# Patient Record
Sex: Male | Born: 1955
Health system: Southern US, Community
[De-identification: ages and names within clinical notes are randomized; demographics above are authoritative.]

## PROBLEM LIST (undated history)

## (undated) DIAGNOSIS — E119 Type 2 diabetes mellitus without complications: Secondary | ICD-10-CM

## (undated) DIAGNOSIS — K219 Gastro-esophageal reflux disease without esophagitis: Secondary | ICD-10-CM

## (undated) DIAGNOSIS — I10 Essential (primary) hypertension: Secondary | ICD-10-CM

## (undated) DIAGNOSIS — E785 Hyperlipidemia, unspecified: Secondary | ICD-10-CM

## (undated) DIAGNOSIS — M795 Residual foreign body in soft tissue: Secondary | ICD-10-CM

## (undated) HISTORY — PX: TONSILLECTOMY: SUR1361

## (undated) HISTORY — PX: CERVICAL FUSION: SHX112

---

## 2003-05-01 ENCOUNTER — Encounter: Admission: RE | Admit: 2003-05-01 | Discharge: 2003-05-01 | Payer: Self-pay | Admitting: Family Medicine

## 2003-05-04 ENCOUNTER — Encounter: Admission: RE | Admit: 2003-05-04 | Discharge: 2003-05-04 | Payer: Self-pay | Admitting: Family Medicine

## 2003-05-15 ENCOUNTER — Ambulatory Visit (HOSPITAL_COMMUNITY): Admission: RE | Admit: 2003-05-15 | Discharge: 2003-05-16 | Payer: Self-pay | Admitting: Neurosurgery

## 2003-07-08 ENCOUNTER — Encounter: Admission: RE | Admit: 2003-07-08 | Discharge: 2003-10-06 | Payer: Self-pay | Admitting: Neurosurgery

## 2006-11-22 ENCOUNTER — Encounter: Admission: RE | Admit: 2006-11-22 | Discharge: 2007-01-10 | Payer: Self-pay | Admitting: Family Medicine

## 2010-05-28 NOTE — Op Note (Signed)
NAME:  Chad Weeks, Chad Weeks NO.:  [id number]   MEDICAL RECORD NO.:  [id number]                   PATIENT TYPE:  OIB   LOCATION:  3013                                 FACILITY:  [facility]   PHYSICIAN:  Clydene Fake, M.D.               DATE OF BIRTH:  [birth year]   DATE OF PROCEDURE:  05/15/2003  DATE OF DISCHARGE:  05/16/2003                                 OPERATIVE REPORT   PREOPERATIVE DIAGNOSIS:  HNP and spondylosis at C6-7, right-sided  radiculopathy.   POSTOPERATIVE DIAGNOSIS:  HNP and spondylosis at C6-7, right-sided  radiculopathy.   PROCEDURE:  Anterior cervical decompression, diskectomy and fusion at C6-7  with LifeNet allograft bone and ___________ anterior cervical plate.   SURGEON:  Clydene Fake, M.D.   ASSISTANT:  Danae Orleans. Venetia Maxon, M.D.   ANESTHESIA:  General endotracheal tube anesthesia.   ESTIMATED BLOOD LOSS:  Minimal.   BLOOD REPLACED:  None.   DRAINS:  None.   COMPLICATIONS:  None.   INDICATIONS FOR PROCEDURE:  The patient is a 55 year old gentleman who in  December had started having neck pain, pain in between shoulder blades and  going down the right arm that resolved slightly, but then recurred eight  weeks or so ago with significant pain now with numbness and weakness in the  arm.  MRI was done showing large disk herniation at C6-7 on the right,  spondylitic changes, and the patient is brought in for decompression and  fusion procedure.   DESCRIPTION OF PROCEDURE:  The patient was brought into the operating room  and after general anesthesia was induced, the patient was placed in 10  pounds of Holter traction and prepped and draped in the usual sterile  fashion.  Incision was injected with 10 mL of 1% lidocaine with epinephrine.  Incision was then made from the midline to the anterior border of the  sternocleidomastoid muscle on the left side and back incision was similarly  taken down to the platysma and hemostasis was  obtained with Bovie  cauterization.  The platysma was incised with the Bovie and then blunt  dissection was taken through the anterior cervical fascia of the anterior  cervical spine.  Needle was placed in the disk space and x-rays were  obtained showing this was the 6-7 disk space.  The disk space was incised  with a 15 blade and partial diskectomy performed with pituitary rongeurs.  Longus coli muscles were reflected laterally using the Bovie and self  retaining retractor system was then placed.  The disk space was then again  incised with a 15 blade and diskectomy performed with pituitary rongeurs and  curets.  Microscope was brought in for microdissection.  At this point, high  speed drill was used to remove cartilaginous endplate and osteophytes.  1  and 2 mm Kerrison punches were then used to remove posterior osteophytes and  posterior ligaments  and free fragments of disk were found.  These were  removed.  They were from the right side and when we removed them we  decompressed the central canal and the lateral nerve root.  Bilateral  foraminotomies were performed and when we had finished, we had good  decompression of the central canal and bilateral nerve roots especially  right.  Hemostasis was obtained with Gelfoam and thrombin and this was  irrigated out.  Height of the disk space was measured with the LifeNet  trials, measuring 7 mm and a 7 mm LifeNet bone was tapped into place,  countersunk about 1.5 mm or so. There was plenty of room for the bone graft  to the dura and checked with nerve hook.  The wound was irrigated with  antibiotic solution.  Prior to bringing the microscope in, distraction pins  were placed in the C6 and 7 and the interspace distracted.  The pins were  now removed and hemostasis was obtained with Gelfoam and thrombin.  The  Uniplate was then placed over the anterior cervical spine and one screw  placed in the C6 and one in C7. These were tightened down and  the locking  mechanism was tightened.  Lateral x-ray was then taken of the neck showing  good position of plate, screws, and interbody bone plug at the C6-7 level.  Retractor was removed.  Hemostasis was obtained with Gelfoam and thrombin  and bipolar cauterization.  Gelfoam was irrigated out and when we had good  hemostasis, the platysma was closed with 3-0 Vicryl interrupted suture, the  subcutaneous tissue closed with the same, and skin closed with Benzoin and  Steri-Strips.  Dressing was placed.  The patient was placed in a soft  cervical collar, awakened from general anesthesia, and transferred to the  recovery room in stable condition.                                               Clydene Fake, M.D.    JRH/MEDQ  D:  05/15/2003  T:  05/16/2003  Job:  [id number]

## 2015-04-30 DIAGNOSIS — S80851A Superficial foreign body, right lower leg, initial encounter: Secondary | ICD-10-CM | POA: Diagnosis not present

## 2015-06-29 DIAGNOSIS — E1165 Type 2 diabetes mellitus with hyperglycemia: Secondary | ICD-10-CM | POA: Diagnosis not present

## 2015-06-29 DIAGNOSIS — E7800 Pure hypercholesterolemia, unspecified: Secondary | ICD-10-CM | POA: Diagnosis not present

## 2015-06-29 DIAGNOSIS — I10 Essential (primary) hypertension: Secondary | ICD-10-CM | POA: Diagnosis not present

## 2015-06-29 DIAGNOSIS — Z0000 Encounter for general adult medical examination without abnormal findings: Secondary | ICD-10-CM | POA: Diagnosis not present

## 2015-06-29 DIAGNOSIS — K219 Gastro-esophageal reflux disease without esophagitis: Secondary | ICD-10-CM | POA: Diagnosis not present

## 2015-07-02 DIAGNOSIS — Z125 Encounter for screening for malignant neoplasm of prostate: Secondary | ICD-10-CM | POA: Diagnosis not present

## 2015-07-02 DIAGNOSIS — Z1159 Encounter for screening for other viral diseases: Secondary | ICD-10-CM | POA: Diagnosis not present

## 2015-07-02 DIAGNOSIS — E1165 Type 2 diabetes mellitus with hyperglycemia: Secondary | ICD-10-CM | POA: Diagnosis not present

## 2015-07-02 DIAGNOSIS — Z0000 Encounter for general adult medical examination without abnormal findings: Secondary | ICD-10-CM | POA: Diagnosis not present

## 2015-10-23 DIAGNOSIS — Z23 Encounter for immunization: Secondary | ICD-10-CM | POA: Diagnosis not present

## 2015-11-03 DIAGNOSIS — S80851D Superficial foreign body, right lower leg, subsequent encounter: Secondary | ICD-10-CM | POA: Diagnosis not present

## 2015-11-03 DIAGNOSIS — M795 Residual foreign body in soft tissue: Secondary | ICD-10-CM | POA: Diagnosis not present

## 2015-11-27 DIAGNOSIS — L0390 Cellulitis, unspecified: Secondary | ICD-10-CM | POA: Diagnosis not present

## 2015-12-29 DIAGNOSIS — E1165 Type 2 diabetes mellitus with hyperglycemia: Secondary | ICD-10-CM | POA: Diagnosis not present

## 2016-02-24 DIAGNOSIS — E119 Type 2 diabetes mellitus without complications: Secondary | ICD-10-CM | POA: Diagnosis not present

## 2016-03-29 DIAGNOSIS — E119 Type 2 diabetes mellitus without complications: Secondary | ICD-10-CM | POA: Diagnosis not present

## 2016-07-28 DIAGNOSIS — Z23 Encounter for immunization: Secondary | ICD-10-CM | POA: Diagnosis not present

## 2016-07-28 DIAGNOSIS — I10 Essential (primary) hypertension: Secondary | ICD-10-CM | POA: Diagnosis not present

## 2016-07-28 DIAGNOSIS — Z0000 Encounter for general adult medical examination without abnormal findings: Secondary | ICD-10-CM | POA: Diagnosis not present

## 2016-07-28 DIAGNOSIS — Z13220 Encounter for screening for lipoid disorders: Secondary | ICD-10-CM | POA: Diagnosis not present

## 2016-07-28 DIAGNOSIS — K219 Gastro-esophageal reflux disease without esophagitis: Secondary | ICD-10-CM | POA: Diagnosis not present

## 2016-07-28 DIAGNOSIS — E1165 Type 2 diabetes mellitus with hyperglycemia: Secondary | ICD-10-CM | POA: Diagnosis not present

## 2016-09-26 DIAGNOSIS — R05 Cough: Secondary | ICD-10-CM | POA: Diagnosis not present

## 2016-10-03 DIAGNOSIS — L03012 Cellulitis of left finger: Secondary | ICD-10-CM | POA: Diagnosis not present

## 2016-10-03 DIAGNOSIS — S60455A Superficial foreign body of left ring finger, initial encounter: Secondary | ICD-10-CM | POA: Diagnosis not present

## 2016-10-06 DIAGNOSIS — L03012 Cellulitis of left finger: Secondary | ICD-10-CM | POA: Diagnosis not present

## 2016-10-06 DIAGNOSIS — I10 Essential (primary) hypertension: Secondary | ICD-10-CM | POA: Diagnosis not present

## 2016-10-06 DIAGNOSIS — E1165 Type 2 diabetes mellitus with hyperglycemia: Secondary | ICD-10-CM | POA: Diagnosis not present

## 2016-11-03 DIAGNOSIS — L03012 Cellulitis of left finger: Secondary | ICD-10-CM | POA: Diagnosis not present

## 2016-11-03 DIAGNOSIS — Z23 Encounter for immunization: Secondary | ICD-10-CM | POA: Diagnosis not present

## 2016-11-03 DIAGNOSIS — T148XXA Other injury of unspecified body region, initial encounter: Secondary | ICD-10-CM | POA: Diagnosis not present

## 2017-01-30 DIAGNOSIS — S60459A Superficial foreign body of unspecified finger, initial encounter: Secondary | ICD-10-CM | POA: Diagnosis not present

## 2017-01-30 DIAGNOSIS — E1165 Type 2 diabetes mellitus with hyperglycemia: Secondary | ICD-10-CM | POA: Diagnosis not present

## 2017-02-06 DIAGNOSIS — M795 Residual foreign body in soft tissue: Secondary | ICD-10-CM | POA: Diagnosis not present

## 2017-02-13 ENCOUNTER — Other Ambulatory Visit: Payer: Self-pay | Admitting: Orthopedic Surgery

## 2017-02-13 DIAGNOSIS — M795 Residual foreign body in soft tissue: Secondary | ICD-10-CM

## 2017-02-14 ENCOUNTER — Ambulatory Visit
Admission: RE | Admit: 2017-02-14 | Discharge: 2017-02-14 | Disposition: A | Discharge status: Home / Self Care | Payer: BLUE CROSS/BLUE SHIELD | Source: Ambulatory Visit | Attending: Orthopedic Surgery | Admitting: Orthopedic Surgery

## 2017-02-14 DIAGNOSIS — M7989 Other specified soft tissue disorders: Secondary | ICD-10-CM | POA: Diagnosis not present

## 2017-02-14 DIAGNOSIS — M795 Residual foreign body in soft tissue: Secondary | ICD-10-CM

## 2017-02-15 ENCOUNTER — Other Ambulatory Visit: Payer: Self-pay | Admitting: Orthopedic Surgery

## 2017-02-15 DIAGNOSIS — M795 Residual foreign body in soft tissue: Secondary | ICD-10-CM

## 2017-02-20 ENCOUNTER — Other Ambulatory Visit: Payer: Self-pay | Admitting: Orthopedic Surgery

## 2017-02-20 DIAGNOSIS — M795 Residual foreign body in soft tissue: Secondary | ICD-10-CM | POA: Diagnosis not present

## 2017-02-21 ENCOUNTER — Other Ambulatory Visit: Payer: Self-pay

## 2017-02-21 ENCOUNTER — Encounter (HOSPITAL_BASED_OUTPATIENT_CLINIC_OR_DEPARTMENT_OTHER)
Admission: RE | Admit: 2017-02-21 | Discharge: 2017-02-21 | Disposition: A | Discharge status: Home / Self Care | Payer: BLUE CROSS/BLUE SHIELD | Source: Ambulatory Visit | Attending: Orthopedic Surgery | Admitting: Orthopedic Surgery

## 2017-02-21 ENCOUNTER — Encounter (HOSPITAL_BASED_OUTPATIENT_CLINIC_OR_DEPARTMENT_OTHER): Payer: Self-pay | Admitting: *Deleted

## 2017-02-21 DIAGNOSIS — Z79899 Other long term (current) drug therapy: Secondary | ICD-10-CM | POA: Diagnosis not present

## 2017-02-21 DIAGNOSIS — E119 Type 2 diabetes mellitus without complications: Secondary | ICD-10-CM | POA: Diagnosis not present

## 2017-02-21 DIAGNOSIS — I10 Essential (primary) hypertension: Secondary | ICD-10-CM | POA: Diagnosis not present

## 2017-02-21 DIAGNOSIS — E785 Hyperlipidemia, unspecified: Secondary | ICD-10-CM | POA: Diagnosis not present

## 2017-02-21 DIAGNOSIS — Z7984 Long term (current) use of oral hypoglycemic drugs: Secondary | ICD-10-CM | POA: Diagnosis not present

## 2017-02-21 DIAGNOSIS — Z9889 Other specified postprocedural states: Secondary | ICD-10-CM | POA: Diagnosis not present

## 2017-02-21 DIAGNOSIS — M60242 Foreign body granuloma of soft tissue, not elsewhere classified, left hand: Secondary | ICD-10-CM | POA: Diagnosis present

## 2017-02-21 DIAGNOSIS — Z981 Arthrodesis status: Secondary | ICD-10-CM | POA: Diagnosis not present

## 2017-02-21 DIAGNOSIS — K219 Gastro-esophageal reflux disease without esophagitis: Secondary | ICD-10-CM | POA: Diagnosis not present

## 2017-02-21 DIAGNOSIS — L928 Other granulomatous disorders of the skin and subcutaneous tissue: Secondary | ICD-10-CM | POA: Diagnosis not present

## 2017-02-21 LAB — BASIC METABOLIC PANEL
Anion gap: 11 (ref 5–15)
BUN: 13 mg/dL (ref 6–20)
CO2: 23 mmol/L (ref 22–32)
Calcium: 9.1 mg/dL (ref 8.9–10.3)
Chloride: 92 mmol/L — ABNORMAL LOW (ref 101–111)
Creatinine, Ser: 0.99 mg/dL (ref 0.61–1.24)
GFR calc Af Amer: >60 mL/min (ref >60)
GFR calc non Af Amer: >60 mL/min (ref >60)
Glucose, Bld: 174 mg/dL — ABNORMAL HIGH (ref 65–99)
Potassium: 5.1 mmol/L (ref 3.5–5.1)
Sodium: 126 mmol/L — ABNORMAL LOW (ref 135–145)

## 2017-02-22 NOTE — Progress Notes (Signed)
Lab results per Dr. Acey Lavarignan- will obtain [place] [place].

## 2017-02-23 ENCOUNTER — Other Ambulatory Visit: Payer: Self-pay

## 2017-02-23 ENCOUNTER — Encounter (HOSPITAL_BASED_OUTPATIENT_CLINIC_OR_DEPARTMENT_OTHER)
Admission: RE | Disposition: A | Discharge status: Home / Self Care | Payer: Self-pay | Source: Ambulatory Visit | Attending: Orthopedic Surgery

## 2017-02-23 ENCOUNTER — Ambulatory Visit (HOSPITAL_BASED_OUTPATIENT_CLINIC_OR_DEPARTMENT_OTHER): Payer: BLUE CROSS/BLUE SHIELD | Admitting: Anesthesiology

## 2017-02-23 ENCOUNTER — Encounter (HOSPITAL_BASED_OUTPATIENT_CLINIC_OR_DEPARTMENT_OTHER): Payer: Self-pay | Admitting: *Deleted

## 2017-02-23 ENCOUNTER — Ambulatory Visit (HOSPITAL_BASED_OUTPATIENT_CLINIC_OR_DEPARTMENT_OTHER)
Admission: RE | Admit: 2017-02-23 | Discharge: 2017-02-23 | Disposition: A | Discharge status: Home / Self Care | Payer: BLUE CROSS/BLUE SHIELD | Source: Ambulatory Visit | Attending: Orthopedic Surgery | Admitting: Orthopedic Surgery

## 2017-02-23 DIAGNOSIS — M7989 Other specified soft tissue disorders: Secondary | ICD-10-CM | POA: Diagnosis not present

## 2017-02-23 DIAGNOSIS — E119 Type 2 diabetes mellitus without complications: Secondary | ICD-10-CM | POA: Insufficient documentation

## 2017-02-23 DIAGNOSIS — E785 Hyperlipidemia, unspecified: Secondary | ICD-10-CM | POA: Insufficient documentation

## 2017-02-23 DIAGNOSIS — K219 Gastro-esophageal reflux disease without esophagitis: Secondary | ICD-10-CM | POA: Insufficient documentation

## 2017-02-23 DIAGNOSIS — Z79899 Other long term (current) drug therapy: Secondary | ICD-10-CM | POA: Insufficient documentation

## 2017-02-23 DIAGNOSIS — Z981 Arthrodesis status: Secondary | ICD-10-CM | POA: Insufficient documentation

## 2017-02-23 DIAGNOSIS — M60242 Foreign body granuloma of soft tissue, not elsewhere classified, left hand: Secondary | ICD-10-CM | POA: Diagnosis not present

## 2017-02-23 DIAGNOSIS — Z9889 Other specified postprocedural states: Secondary | ICD-10-CM | POA: Insufficient documentation

## 2017-02-23 DIAGNOSIS — D2112 Benign neoplasm of connective and other soft tissue of left upper limb, including shoulder: Secondary | ICD-10-CM | POA: Diagnosis not present

## 2017-02-23 DIAGNOSIS — I10 Essential (primary) hypertension: Secondary | ICD-10-CM | POA: Insufficient documentation

## 2017-02-23 DIAGNOSIS — Z7984 Long term (current) use of oral hypoglycemic drugs: Secondary | ICD-10-CM | POA: Diagnosis not present

## 2017-02-23 DIAGNOSIS — L928 Other granulomatous disorders of the skin and subcutaneous tissue: Secondary | ICD-10-CM | POA: Diagnosis not present

## 2017-02-23 HISTORY — PX: INCISION AND DRAINAGE WOUND WITH FOREIGN BODY REMOVAL: SHX5635

## 2017-02-23 HISTORY — DX: Type 2 diabetes mellitus without complications: E11.9

## 2017-02-23 HISTORY — DX: Essential (primary) hypertension: I10

## 2017-02-23 HISTORY — DX: Gastro-esophageal reflux disease without esophagitis: K21.9

## 2017-02-23 HISTORY — DX: Hyperlipidemia, unspecified: E78.5

## 2017-02-23 HISTORY — DX: Residual foreign body in soft tissue: M79.5

## 2017-02-23 LAB — POCT I-STAT, CHEM 8
BUN: 17 mg/dL (ref 6–20)
Calcium, Ion: 1.13 mmol/L — ABNORMAL LOW (ref 1.15–1.40)
Chloride: 92 mmol/L — ABNORMAL LOW (ref 101–111)
Creatinine, Ser: 0.9 mg/dL (ref 0.61–1.24)
Glucose, Bld: 177 mg/dL — ABNORMAL HIGH (ref 65–99)
HCT: 38 % — ABNORMAL LOW (ref 39.0–52.0)
Hemoglobin: 12.9 g/dL — ABNORMAL LOW (ref 13.0–17.0)
Potassium: 4.6 mmol/L (ref 3.5–5.1)
Sodium: 128 mmol/L — ABNORMAL LOW (ref 135–145)
TCO2: 24 mmol/L (ref 22–32)

## 2017-02-23 LAB — GLUCOSE, CAPILLARY: Glucose-Capillary: 158 mg/dL — ABNORMAL HIGH (ref 65–99)

## 2017-02-23 SURGERY — INCISION AND DRAINAGE WOUND WITH FOREIGN BODY REMOVAL
Anesthesia: General | Site: Finger | Laterality: Left

## 2017-02-23 MED ORDER — LIDOCAINE HCL (PF) 0.5 % IJ SOLN
INTRAMUSCULAR | Status: DC | PRN
  Administered 2017-02-23: 35 mL via INTRAVENOUS

## 2017-02-23 MED ORDER — LACTATED RINGERS IV SOLN
INTRAVENOUS | Status: DC
  Administered 2017-02-23: 12:00:00 via INTRAVENOUS

## 2017-02-23 MED ORDER — MIDAZOLAM HCL 2 MG/2ML IJ SOLN
INTRAMUSCULAR | Status: AC
  Filled 2017-02-23: qty 2

## 2017-02-23 MED ORDER — FENTANYL CITRATE (PF) 100 MCG/2ML IJ SOLN
50.0000 ug | INTRAMUSCULAR | Status: DC | PRN
  Administered 2017-02-23 (×2): 50 ug via INTRAVENOUS

## 2017-02-23 MED ORDER — CEFAZOLIN SODIUM-DEXTROSE 2-4 GM/100ML-% IV SOLN
INTRAVENOUS | Status: AC
  Filled 2017-02-23: qty 100

## 2017-02-23 MED ORDER — PROPOFOL 500 MG/50ML IV EMUL
INTRAVENOUS | Status: DC | PRN
  Administered 2017-02-23: 75 ug/kg/min via INTRAVENOUS

## 2017-02-23 MED ORDER — ONDANSETRON HCL 4 MG/2ML IJ SOLN
INTRAMUSCULAR | Status: DC | PRN
  Administered 2017-02-23: 4 mg via INTRAVENOUS

## 2017-02-23 MED ORDER — TRAMADOL HCL 50 MG PO TABS: 50.0000 mg | ORAL_TABLET | Freq: Four times a day (QID) | ORAL | 0 refills | Status: DC | PRN

## 2017-02-23 MED ORDER — CHLORHEXIDINE GLUCONATE 4 % EX LIQD: 60.0000 mL | Freq: Once | CUTANEOUS | Status: DC

## 2017-02-23 MED ORDER — SCOPOLAMINE 1 MG/3DAYS TD PT72: 1.0000 | MEDICATED_PATCH | Freq: Once | TRANSDERMAL | Status: DC | PRN

## 2017-02-23 MED ORDER — MIDAZOLAM HCL 2 MG/2ML IJ SOLN
1.0000 mg | INTRAMUSCULAR | Status: DC | PRN
  Administered 2017-02-23 (×2): 1 mg via INTRAVENOUS

## 2017-02-23 MED ORDER — CEFAZOLIN SODIUM-DEXTROSE 2-4 GM/100ML-% IV SOLN
2.0000 g | INTRAVENOUS | Status: AC
  Administered 2017-02-23: 2 g via INTRAVENOUS

## 2017-02-23 MED ORDER — FENTANYL CITRATE (PF) 100 MCG/2ML IJ SOLN
INTRAMUSCULAR | Status: AC
  Filled 2017-02-23: qty 2

## 2017-02-23 MED ORDER — ONDANSETRON HCL 4 MG/2ML IJ SOLN
INTRAMUSCULAR | Status: AC
  Filled 2017-02-23: qty 2

## 2017-02-23 MED ORDER — BUPIVACAINE HCL (PF) 0.25 % IJ SOLN
INTRAMUSCULAR | Status: DC | PRN
  Administered 2017-02-23: 8.5 mL

## 2017-02-23 SURGICAL SUPPLY — 48 items
BAG DECANTER FOR FLEXI CONT (MISCELLANEOUS) IMPLANT
BLADE MINI RND TIP GREEN BEAV (BLADE) IMPLANT
BLADE SURG 15 STRL LF DISP TIS (BLADE) ×1 IMPLANT
BLADE SURG 15 STRL SS (BLADE) ×1
BNDG COHESIVE 1X5 TAN STRL LF (GAUZE/BANDAGES/DRESSINGS) ×2 IMPLANT
BNDG COHESIVE 3X5 TAN STRL LF (GAUZE/BANDAGES/DRESSINGS) IMPLANT
BNDG ESMARK 4X9 LF (GAUZE/BANDAGES/DRESSINGS) ×2 IMPLANT
BNDG GAUZE ELAST 4 BULKY (GAUZE/BANDAGES/DRESSINGS) IMPLANT
CHLORAPREP W/TINT 26ML (MISCELLANEOUS) ×2 IMPLANT
CORD BIPOLAR FORCEPS 12FT (ELECTRODE) IMPLANT
COVER BACK TABLE 60X90IN (DRAPES) ×2 IMPLANT
COVER MAYO STAND STRL (DRAPES) ×2 IMPLANT
CUFF TOURNIQUET SINGLE 18IN (TOURNIQUET CUFF) ×2 IMPLANT
DRAPE EXTREMITY T 121X128X90 (DRAPE) IMPLANT
DRAPE IMP U-DRAPE 54X76 (DRAPES) ×2 IMPLANT
DRAPE LAPAROTOMY 100X72 PEDS (DRAPES) ×2 IMPLANT
DRAPE SURG 17X23 STRL (DRAPES) ×2 IMPLANT
GAUZE PACKING IODOFORM 1/4X15 (GAUZE/BANDAGES/DRESSINGS) IMPLANT
GAUZE SPONGE 4X4 12PLY STRL (GAUZE/BANDAGES/DRESSINGS) ×2 IMPLANT
GAUZE XEROFORM 1X8 LF (GAUZE/BANDAGES/DRESSINGS) ×2 IMPLANT
GLOVE BIOGEL PI IND STRL 7.0 (GLOVE) ×2 IMPLANT
GLOVE BIOGEL PI IND STRL 8.5 (GLOVE) ×1 IMPLANT
GLOVE BIOGEL PI INDICATOR 7.0 (GLOVE) ×2
GLOVE BIOGEL PI INDICATOR 8.5 (GLOVE) ×1
GLOVE ECLIPSE 6.5 STRL STRAW (GLOVE) ×2 IMPLANT
GLOVE SURG ORTHO 8.0 STRL STRW (GLOVE) ×2 IMPLANT
GOWN STRL REUS W/ TWL LRG LVL3 (GOWN DISPOSABLE) ×1 IMPLANT
GOWN STRL REUS W/TWL LRG LVL3 (GOWN DISPOSABLE) ×1
GOWN STRL REUS W/TWL XL LVL3 (GOWN DISPOSABLE) ×2 IMPLANT
LOOP VESSEL MAXI BLUE (MISCELLANEOUS) IMPLANT
NEEDLE PRECISIONGLIDE 27X1.5 (NEEDLE) ×2 IMPLANT
NS IRRIG 1000ML POUR BTL (IV SOLUTION) ×2 IMPLANT
PACK BASIN DAY SURGERY FS (CUSTOM PROCEDURE TRAY) ×2 IMPLANT
PAD CAST 3X4 CTTN HI CHSV (CAST SUPPLIES) IMPLANT
PADDING CAST ABS 4INX4YD NS (CAST SUPPLIES)
PADDING CAST ABS COTTON 4X4 ST (CAST SUPPLIES) IMPLANT
PADDING CAST COTTON 3X4 STRL (CAST SUPPLIES)
SPLINT PLASTER CAST XFAST 3X15 (CAST SUPPLIES) IMPLANT
SPLINT PLASTER XTRA FASTSET 3X (CAST SUPPLIES)
STOCKINETTE 4X48 STRL (DRAPES) ×2 IMPLANT
SUT ETHILON 4 0 PS 2 18 (SUTURE) ×2 IMPLANT
SWAB COLLECTION DEVICE MRSA (MISCELLANEOUS) IMPLANT
SWAB CULTURE ESWAB REG 1ML (MISCELLANEOUS) IMPLANT
SYR BULB 3OZ (MISCELLANEOUS) ×2 IMPLANT
SYR CONTROL 10ML LL (SYRINGE) ×2 IMPLANT
TOWEL OR 17X24 6PK STRL BLUE (TOWEL DISPOSABLE) ×2 IMPLANT
TUBE FEEDING ENTERAL 5FR 16IN (TUBING) IMPLANT
UNDERPAD 30X30 (UNDERPADS AND DIAPERS) IMPLANT

## 2017-02-23 NOTE — Discharge Instructions (Addendum)

## 2017-02-23 NOTE — Brief Op Note (Signed)
02/23/2017  12:53 PM  PATIENT:  Catalina AntiguaFred R Pesci  62 y.o. male  PRE-OPERATIVE DIAGNOSIS:  FOREIGN BODY LEFT RING FINGER  POST-OPERATIVE DIAGNOSIS:  FOREIGN BODY LEFT RING FINGER  PROCEDURE:  Procedure(s): INCISION AND DRAINAGE WOUND LEFT RING FINGER WITH FOREIGN BODY REMOVAL (Left)  SURGEON:  Surgeon(s) and Role:    Cindee Salt* Kaleo Condrey, MD - Primary  PHYSICIAN ASSISTANT:   ASSISTANTS: none   ANESTHESIA:   local, regional and IV sedation  EBL:  15 mL   BLOOD ADMINISTERED:none  DRAINS: none   LOCAL MEDICATIONS USED:  BUPIVICAINE   SPECIMEN:  Excision  DISPOSITION OF SPECIMEN:  PATHOLOGY  COUNTS:  YES  TOURNIQUET:   Total Tourniquet Time Documented: Forearm (Left) - 22 minutes Total: Forearm (Left) - 22 minutes   DICTATION: .Other Dictation: Dictation Number [id number]  PLAN OF CARE: Discharge to home after PACU  PATIENT DISPOSITION:  PACU - hemodynamically stable.

## 2017-02-23 NOTE — Transfer of Care (Signed)
Immediate Anesthesia Transfer of Care Note  Patient: Chad Weeks  Procedure(s) Performed: INCISION AND DRAINAGE WOUND LEFT RING FINGER WITH FOREIGN BODY REMOVAL (Left Finger)  Patient Location: PACU  Anesthesia Type:MAC and Bier block  Level of Consciousness: awake, alert  and oriented  Airway & Oxygen Therapy: Patient Spontanous Breathing and Patient connected to face mask oxygen  Post-op Assessment: Report given to RN and Post -op Vital signs reviewed and stable  Post vital signs: Reviewed and stable  Last Vitals:  Vitals:   02/23/17 1119  BP: (!) 163/75  Pulse: 79  Resp: 18  Temp: 36.7 C  SpO2: 100%    Last Pain:  Vitals:   02/23/17 1119  TempSrc: Oral         Complications: No apparent anesthesia complications

## 2017-02-23 NOTE — Anesthesia Postprocedure Evaluation (Signed)
Anesthesia Post Note  Patient: Catalina AntiguaFred R Bachand  Procedure(s) Performed: INCISION AND DRAINAGE WOUND LEFT RING FINGER WITH FOREIGN BODY REMOVAL (Left Finger)     Patient location during evaluation: PACU Anesthesia Type: General Level of consciousness: awake and alert Pain management: pain level controlled Vital Signs Assessment: post-procedure vital signs reviewed and stable Respiratory status: spontaneous breathing, nonlabored ventilation, respiratory function stable and patient connected to nasal cannula oxygen Cardiovascular status: blood pressure returned to baseline and stable Postop Assessment: no apparent nausea or vomiting Anesthetic complications: no    Last Vitals:  Vitals:   02/23/17 1330 02/23/17 1352  BP: 133/68 (!) 146/78  Pulse: 74 75  Resp: 17 18  Temp:  37 C  SpO2: 100% 100%    Last Pain:  Vitals:   02/23/17 1352  TempSrc:   PainSc: 0-No pain                 Kelissa Merlin

## 2017-02-23 NOTE — H&P (Signed)
Chad Weeks is an 62 y.o. male.   Chief Complaint: foreign body  Left ring finger   HPI: Chad Weeks is a 62 year old right-hand-dominant male who comes in with pain and swelling of his left ring finger pulp. He has had multiple pieces of wood which he is removed over the past 4 months he was cleaning up after [employer] [place] when he got a piece of splinter in his finger he has attempted to remove it multiple times last being approximately 2 weeks ago where he removed a small piece of wood. He states it entered on the ulnar aspect of the pulp exited on the radial aspect of the pulp. Has been on multiple course of Septra he complains of more an annoyance type pain with a VAS score 3/10. Nothing seems to make it better or worse other than when it begins to fester and spits out a small piece of wood. He has a history of diabetes no history of thyroid problems or gout. Family history is negative for each of these also. He was referred for ultrasound which is been done revealing a retained foreign body with questionable abscess he states that he has just before the ultrasound was done and another piece did emerge. Has been on Septra           Past Medical History:  Diagnosis Date  . Diabetes mellitus without complication ([place])   . Foreign body (FB) in soft tissue    left ring finger  . GERD (gastroesophageal reflux disease)   . Hyperlipidemia   . Hypertension     Past Surgical History:  Procedure Laterality Date  . CERVICAL FUSION    . TONSILLECTOMY      History reviewed. No pertinent family history. Social History:  reports that  has never smoked. he has never used smokeless tobacco. He reports that he drinks alcohol. He reports that he does not use drugs.  Allergies: No Known Allergies  Medications Prior to Admission  Medication Sig Dispense Refill  . atorvastatin (LIPITOR) 20 MG tablet Take 20 mg by mouth daily.    Marland Kitchen glipiZIDE (GLUCOTROL) 10 MG tablet Take 10 mg by mouth daily  before breakfast.    . lisinopril-hydrochlorothiazide (PRINZIDE,ZESTORETIC) 20-25 MG tablet Take 1 tablet by mouth daily.    . metFORMIN (GLUCOPHAGE) 1000 MG tablet Take 1,000 mg by mouth 2 (two) times daily with a meal.    . omeprazole (PRILOSEC) 20 MG capsule Take 20 mg by mouth daily.      Results for orders placed or performed during the hospital encounter of 02/23/17 (from the past 48 hour(s))  Basic metabolic panel     Status: Abnormal   Collection Time: 02/21/17  3:18 PM  Result Value Ref Range   Sodium 126 (L) 135 - 145 mmol/L   Potassium 5.1 3.5 - 5.1 mmol/L   Chloride 92 (L) 101 - 111 mmol/L   CO2 23 22 - 32 mmol/L   Glucose, Bld 174 (H) 65 - 99 mg/dL   BUN 13 6 - 20 mg/dL   Creatinine, Ser 0.99 0.61 - 1.24 mg/dL   Calcium 9.1 8.9 - 10.3 mg/dL   GFR calc non Af Amer >60 >60 mL/min   GFR calc Af Amer >60 >60 mL/min    Comment: (NOTE) The eGFR has been calculated using the CKD EPI equation. This calculation has not been validated in all clinical situations. eGFR's persistently <60 mL/min signify possible Chronic Kidney Disease.    Anion gap 11 5 -  15    Comment: Performed at [facility], [place] [address] [place], [place] [address]    No results found.   Pertinent items are noted in HPI.  Height 5' 9"  (1.753 m), weight 104.3 kg (230 lb).  General appearance: alert, cooperative and appears stated age Head: Normocephalic, without obvious abnormality Neck: no adenopathy and no JVD Resp: clear to auscultation bilaterally Cardio: regular rate and rhythm, S1, S2 normal, no murmur, click, rub or gallop GI: soft, non-tender; bowel sounds normal; no masses,  no organomegaly Extremities: mass left ring finger Pulses: 2+ and symmetric Skin: Skin color, texture, turgor normal. No rashes or lesions Neurologic: Grossly normal Incision/Wound: Not open  Assessment/Plan Assessment:   Foreign body (FB) in soft tissue    Plan: Like to have this removed. We will  schedule him for removal of foreign body possible drainage of abscess with cultures as an outpatient under regional anesthesia. Pre-peri-postoperative course are discussed with him and his wife. They are he is aware that we may leave this wound open following the surgical removal.      Inioluwa Boulay R 02/23/2017, 11:02 AM

## 2017-02-23 NOTE — Anesthesia Preprocedure Evaluation (Signed)
Anesthesia Evaluation  Patient identified by MRN, date of birth, ID band Patient awake    Reviewed: Allergy & Precautions, NPO status , Patient's Chart, lab work & pertinent test results  Airway Mallampati: II  TM Distance: >3 FB Neck ROM: Full    Dental no notable dental hx.    Pulmonary neg pulmonary ROS,    Pulmonary exam normal breath sounds clear to auscultation       Cardiovascular hypertension, Pt. on medications negative cardio ROS Normal cardiovascular exam Rhythm:Regular Rate:Normal     Neuro/Psych negative neurological ROS  negative psych ROS   GI/Hepatic negative GI ROS, Neg liver ROS, GERD  Medicated,  Endo/Other  negative endocrine ROSdiabetes, Oral Hypoglycemic Agents  Renal/GU negative Renal ROS  negative genitourinary   Musculoskeletal negative musculoskeletal ROS (+)   Abdominal   Peds negative pediatric ROS (+)  Hematology negative hematology ROS (+)   Anesthesia Other Findings   Reproductive/Obstetrics negative OB ROS                            Anesthesia Physical Anesthesia Plan  ASA: III  Anesthesia Plan: General   Post-op Pain Management:    Induction: Intravenous  PONV Risk Score and Plan: 2 and Ondansetron and Treatment may vary due to age or medical condition  Airway Management Planned: LMA  Additional Equipment:   Intra-op Plan:   Post-operative Plan:   Informed Consent:   Plan Discussed with: CRNA and Surgeon  Anesthesia Plan Comments: ( )        Anesthesia Quick Evaluation

## 2017-02-23 NOTE — Op Note (Signed)
:   Dictation Number [id number]

## 2017-02-23 NOTE — Op Note (Signed)
NAME:  Chad Weeks, Chad Weeks NO.:  [id number]  MEDICAL RECORD NO.:  [id number]  LOCATION:                                 FACILITY:  PHYSICIAN:  Cindee Salt, M.D.            DATE OF BIRTH:  DATE OF PROCEDURE:  02/23/2017 DATE OF DISCHARGE:                              OPERATIVE REPORT   PREOPERATIVE DIAGNOSIS:  Foreign body granuloma, left ring finger.  POSTOPERATIVE DIAGNOSIS:  Foreign body granuloma, left ring finger.  OPERATION:  Excision foreign body granuloma, left ring finger.  SURGEON:  Cindee Salt, MD.  ASSISTANT:  None.  ANESTHESIA:  Forearm IV regional with metacarpal block and IV sedation.  PLACE OF SURGERY:  [facility].  ANESTHESIOLOGIST:  Raul Del, M.D.  HISTORY:  The patient is a 62 year old male with a history of an injury to his left ring finger with piece of wood.  He has had multiple splinters removed.  He recently had increased swelling with extrusion of another piece of wood.  An ultrasound done afterwards showed a probable retained foreign body with an abscess.  This has settled.  He is admitted now for incision and drainage, debridement, excision of foreign body.  Foreign body granuloma as dictated by findings.  He is aware that there is no guarantee to the surgeon; the possibility of infection; recurrence of injury to arteries, nerves, tendons; incomplete relief of symptoms; dystrophy; the possibility of retained material despite debridement.  In the preoperative area, the patient is seen, the extremity marked by both patient and surgeon.  Antibiotic given.  DESCRIPTION OF PROCEDURE:  The patient was brought to the operating room, where a forearm IV regional anesthetic was carried out without difficulty under the direction of the Anesthesia Department.  He was prepped using ChloraPrep in supine position with left arm free.  A 3- minute dry time was allowed and time-out taken, confirmed the patient and procedure.  A  metacarpal block was given 0.25% bupivacaine without epinephrine 9 mL was used.  An oblique incision was made over the area of swelling, the area of egress of the old last foreign material.  This was carried down through subcutaneous tissue.  A granulomatous friable area was immediately encountered.  This was able to be isolated and excised in toto. It measured .75 cm.  It was assumed that the foreign body was within this area.  Further debridement was performed.  Neurovascular bundles were identified both radially and ulnarly.  Any foreign looking material directly under the dermis was also removed with a hemostatic rongeur. The entire specimen was sent to Pathology.  No other foreign material was noted.  The wound was copiously irrigated with saline.  The skin was closed with a single 4-0 nylon sutures leaving the ends on either side slightly open.  A sterile compressive dressing was applied.  On deflation of the tourniquet, remaining fingers pinked. He was taken to the recovery room for observation in satisfactory condition.  He will be discharged home to return to the [facility] in 1 week, on tramadol.          ______________________________  Cindee SaltGary Damira Kem, M.D.     GK/MEDQ  D:  02/23/2017  T:  02/23/2017  Job:  [id number]

## 2017-02-24 ENCOUNTER — Encounter (HOSPITAL_BASED_OUTPATIENT_CLINIC_OR_DEPARTMENT_OTHER): Payer: Self-pay | Admitting: Orthopedic Surgery

## 2017-04-17 DIAGNOSIS — R05 Cough: Secondary | ICD-10-CM | POA: Diagnosis not present

## 2017-05-10 DIAGNOSIS — R51 Headache: Secondary | ICD-10-CM | POA: Diagnosis not present

## 2017-05-10 DIAGNOSIS — I10 Essential (primary) hypertension: Secondary | ICD-10-CM | POA: Diagnosis not present

## 2017-05-11 ENCOUNTER — Other Ambulatory Visit: Payer: Self-pay

## 2017-05-11 ENCOUNTER — Emergency Department (HOSPITAL_COMMUNITY)
Admission: EM | Admit: 2017-05-11 | Discharge: 2017-05-12 | Disposition: A | Discharge status: Home / Self Care | Payer: BLUE CROSS/BLUE SHIELD | Attending: Emergency Medicine | Admitting: Emergency Medicine

## 2017-05-11 ENCOUNTER — Emergency Department (HOSPITAL_COMMUNITY): Payer: BLUE CROSS/BLUE SHIELD

## 2017-05-11 ENCOUNTER — Encounter (HOSPITAL_COMMUNITY): Payer: Self-pay | Admitting: *Deleted

## 2017-05-11 DIAGNOSIS — E119 Type 2 diabetes mellitus without complications: Secondary | ICD-10-CM | POA: Diagnosis not present

## 2017-05-11 DIAGNOSIS — Z79899 Other long term (current) drug therapy: Secondary | ICD-10-CM | POA: Insufficient documentation

## 2017-05-11 DIAGNOSIS — I10 Essential (primary) hypertension: Secondary | ICD-10-CM | POA: Diagnosis not present

## 2017-05-11 DIAGNOSIS — Z7984 Long term (current) use of oral hypoglycemic drugs: Secondary | ICD-10-CM | POA: Insufficient documentation

## 2017-05-11 DIAGNOSIS — R51 Headache: Secondary | ICD-10-CM | POA: Insufficient documentation

## 2017-05-11 LAB — COMPREHENSIVE METABOLIC PANEL
ALT: 16 U/L — ABNORMAL LOW (ref 17–63)
AST: 13 U/L — ABNORMAL LOW (ref 15–41)
Albumin: 3.8 g/dL (ref 3.5–5.0)
Alkaline Phosphatase: 43 U/L (ref 38–126)
Anion gap: 10 (ref 5–15)
BUN: 13 mg/dL (ref 6–20)
CO2: 26 mmol/L (ref 22–32)
Calcium: 8.8 mg/dL — ABNORMAL LOW (ref 8.9–10.3)
Chloride: 93 mmol/L — ABNORMAL LOW (ref 101–111)
Creatinine, Ser: 0.98 mg/dL (ref 0.61–1.24)
GFR calc Af Amer: >60 mL/min (ref >60)
GFR calc non Af Amer: >60 mL/min (ref >60)
Glucose, Bld: 141 mg/dL — ABNORMAL HIGH (ref 65–99)
Potassium: 4.5 mmol/L (ref 3.5–5.1)
Sodium: 129 mmol/L — ABNORMAL LOW (ref 135–145)
Total Bilirubin: 0.8 mg/dL (ref 0.3–1.2)
Total Protein: 5.9 g/dL — ABNORMAL LOW (ref 6.5–8.1)

## 2017-05-11 LAB — CBC WITH DIFFERENTIAL/PLATELET
Basophils Absolute: 0 10*3/uL (ref 0.0–0.1)
Basophils Relative: 0 %
Eosinophils Absolute: 0.1 10*3/uL (ref 0.0–0.7)
Eosinophils Relative: 2 %
HCT: 38 % — ABNORMAL LOW (ref 39.0–52.0)
Hemoglobin: 12.7 g/dL — ABNORMAL LOW (ref 13.0–17.0)
Lymphocytes Relative: 22 %
Lymphs Abs: 1.1 10*3/uL (ref 0.7–4.0)
MCH: 31 pg (ref 26.0–34.0)
MCHC: 33.4 g/dL (ref 30.0–36.0)
MCV: 92.7 fL (ref 78.0–100.0)
Monocytes Absolute: 0.4 10*3/uL (ref 0.1–1.0)
Monocytes Relative: 8 %
Neutro Abs: 3.4 10*3/uL (ref 1.7–7.7)
Neutrophils Relative %: 68 %
Platelets: 232 10*3/uL (ref 150–400)
RBC: 4.1 MIL/uL — ABNORMAL LOW (ref 4.22–5.81)
RDW: 13.8 % (ref 11.5–15.5)
WBC: 4.9 10*3/uL (ref 4.0–10.5)

## 2017-05-11 LAB — URINALYSIS, ROUTINE W REFLEX MICROSCOPIC
Bacteria, UA: NONE SEEN
Bilirubin Urine: NEGATIVE
Glucose, UA: 50 mg/dL — AB
Hgb urine dipstick: NEGATIVE
Ketones, ur: NEGATIVE mg/dL
Leukocytes, UA: NEGATIVE
Nitrite: NEGATIVE
Protein, ur: 100 mg/dL — AB
Specific Gravity, Urine: 1.01 (ref 1.005–1.030)
pH: 6 (ref 5.0–8.0)

## 2017-05-11 LAB — I-STAT TROPONIN, ED: Troponin i, poc: 0.01 ng/mL (ref 0.00–0.08)

## 2017-05-11 NOTE — ED Triage Notes (Signed)
Pt in stating he has had an ongoing headache and noted increased BP when checked at home, seen by PCP yesterday and started on a beta blocker, pt also reports at times they have noted a significant BP difference between his right and left arm- no difference in traige

## 2017-05-11 NOTE — ED Provider Notes (Signed)
Patient placed in Quick Look pathway, seen and evaluated   Chief Complaint: Headache  HPI:   Chad Weeks is a 62 y.o. male, with a history of HTN, presenting to the ED with headache. Headache is bilateral frontal, currently 5/10, sharp, nonradiating.  Headache started at the same time he noticed his low blood pressure became uncontrolled.  Both of these seem to coincide with his left hand surgery February 23, 2017. Last week, patient had an episode of headache accompanied by left facial and left arm numbness and difficulty speaking.  This episode lasted for approximately 10 minutes. This evening at around 6 PM, patient states he began to feel the numbness again in the left side of his face and left arm, but without difficulty speaking.  This episode lasted for less than 5 minutes.  For the past week, his blood pressure has been running around 170/100. Patient saw his PCP yesterday who added 6.25 mg carvedilol twice daily to his hypertension medications. He took a dose last night and one this morning.   Denies vomiting, chest pain, shortness of breath, weakness, falls/head trauma, dizziness, facial droop, or confusion.   ROS: headache (one)  Physical Exam:   Gen: No distress  Neuro: Awake and Alert  Skin: Warm    Focused Exam:    No diaphoresis.  No pallor.  Pulmonary: No increased work of breathing.  Speaks in full sentences without difficulty.  Lung sounds clear.  No tachypnea.  Cardiac: Normal rate and regular. Peripheral pulses intact.  Neurologic: No sensory deficits.  No noted speech deficits. No aphasia. Patient handles oral secretions without difficulty. No noted swallowing defects. Equal grip strength bilaterally. Strength 5/5 in the upper extremities. Strength 5/5 with flexion and extension of the hips, knees, and ankles bilaterally.  Negative Romberg. No gait disturbance.  Coordination intact including heel to shin and finger to nose.  Cranial nerves III-XII grossly  intact.  No facial droop.   MSK: No peripheral edema.   **Patient was told to take a dose of his 6.25 mg carvedilol**  Initiation of care has begun. The patient has been counseled on the process, plan, and necessity for staying for the completion/evaluation, and the remainder of the medical screening examination   Chad Weeks 05/11/17 1953    Chad Loveless, MD 05/11/17 2355

## 2017-05-12 NOTE — ED Provider Notes (Signed)
TIME SEEN: 12:26 AM  CHIEF COMPLAINT: Hypertension, headache  HPI: Patient is a 62 year old male with history of hypertension, hyperlipidemia, diabetes who presents to the emergency department with complaints of headache.  Patient reports that he has been on lisinopril 40 mg daily for several years.  States over the past 8 to 9 weeks he has had elevated blood pressures and was recently seen by his PCP Dr. Tenny Craw at [facility] and started on Coreg 6.25 mg twice daily.  Has taken a total of 3 doses of this medication.  States tonight he started having a headache that he took ibuprofen for.  It was not sudden onset, severe in nature or the worst headache of his life.  States that he just felt "weird" all over.  No numbness, tingling or focal weakness.  No chest pain or shortness of breath.  No vision changes.  He reports compliance with his lisinopril.  States he is feeling much better now.  ROS: See HPI Constitutional: no fever  Eyes: no drainage  ENT: no runny nose   Cardiovascular:  no chest pain  Resp: no SOB  GI: no vomiting GU: no dysuria Integumentary: no rash  Allergy: no hives  Musculoskeletal: no leg swelling  Neurological: no slurred speech ROS otherwise negative  PAST MEDICAL HISTORY/PAST SURGICAL HISTORY:  Past Medical History:  Diagnosis Date  . Diabetes mellitus without complication (HCC)   . Foreign body (FB) in soft tissue    left ring finger  . GERD (gastroesophageal reflux disease)   . Hyperlipidemia   . Hypertension     MEDICATIONS:  Prior to Admission medications   Medication Sig Start Date End Date Taking? Authorizing Provider  albuterol (PROAIR HFA) 108 (90 Base) MCG/ACT inhaler Inhale 2 puffs into the lungs every 6 (six) hours as needed for wheezing or shortness of breath.   Yes [provider]  atorvastatin (LIPITOR) 20 MG tablet Take 20 mg by mouth daily.   Yes [provider]  carvedilol (COREG) 6.25 MG tablet Take 6.25 mg by mouth 2  (two) times daily with a meal.   Yes [provider]  glipiZIDE (GLUCOTROL XL) 10 MG 24 hr tablet Take 10 mg by mouth 2 (two) times daily.   Yes [provider]  ibuprofen (ADVIL,MOTRIN) 200 MG tablet Take 800 mg by mouth every 6 (six) hours as needed (for headaches).   Yes [provider]  lisinopril (PRINIVIL,ZESTRIL) 40 MG tablet Take 40 mg by mouth daily.   Yes [provider]  metFORMIN (GLUCOPHAGE-XR) 500 MG 24 hr tablet Take 1,000 mg by mouth 2 (two) times daily.   Yes [provider]  omeprazole (PRILOSEC) 40 MG capsule Take 40 mg by mouth daily as needed (for heartburn).   Yes [provider]  pioglitazone (ACTOS) 45 MG tablet Take 45 mg by mouth at bedtime.   Yes [provider]  glipiZIDE (GLUCOTROL) 10 MG tablet Take 10 mg by mouth daily before breakfast.    [provider]  traMADol (ULTRAM) 50 MG tablet Take 1 tablet (50 mg total) by mouth every 6 (six) hours as needed. Patient not taking: Reported on 05/11/2017 02/23/17   Cindee Salt, MD    ALLERGIES:  No Known Allergies  SOCIAL HISTORY:  Social History   Tobacco Use  . Smoking status: Never Smoker  . Smokeless tobacco: Never Used  Substance Use Topics  . Alcohol use: Yes    Comment: social    FAMILY HISTORY: History reviewed. No  pertinent family history.  EXAM: BP (!) 163/72 (BP Location: Left Arm)   Pulse 85   Temp 99 F (37.2 C) (Oral)   Resp 16   SpO2 100%  CONSTITUTIONAL: Alert and oriented and responds appropriately to questions. Well-appearing; well-nourished HEAD: Normocephalic EYES: Conjunctivae clear, pupils appear equal, EOMI ENT: normal nose; moist mucous membranes NECK: Supple, no meningismus, no nuchal rigidity, no LAD  CARD: RRR; S1 and S2 appreciated; no murmurs, no clicks, no rubs, no gallops RESP: Normal chest excursion without splinting or tachypnea; breath sounds clear and equal bilaterally; no wheezes, no rhonchi, no  rales, no hypoxia or respiratory distress, speaking full sentences ABD/GI: Normal bowel sounds; non-distended; soft, non-tender, no rebound, no guarding, no peritoneal signs, no hepatosplenomegaly BACK:  The back appears normal and is non-tender to palpation, there is no CVA tenderness EXT: Normal ROM in all joints; non-tender to palpation; no edema; normal capillary refill; no cyanosis, no calf tenderness or swelling    SKIN: Normal color for age and race; warm; no rash NEURO: Moves all extremities equally, sensation to light touch intact diffusely, strength 5/5 in all 4 extremities, cranial nerves II through XII intact, normal speech, normal gait PSYCH: The patient's mood and manner are appropriate. Grooming and personal hygiene are appropriate.  MEDICAL DECISION MAKING: Patient here with complaints of hypertension.  Had initially had gradual onset headache.  No headache exam is completely resolved and blood pressure is now 140/70s.  He agrees that he likely has not given his Coreg time to work.  He has no focal neurologic deficits.  Work-up obtained in triage is unremarkable other than very mild hyponatremia with sodium of 129 and proteinuria with no old for comparison.  Troponin is negative.  Head CT normal.  No other signs of endorgan damage.  His blood pressure and headache have improved without any intervention.  I recommend close follow-up with his primary care physician.  Patient wife are comfortable with this plan.  We did discuss at length return precautions.  I do not feel at this time he needs his blood pressure medication adjusted any further and discussed with him that I would give his Coreg more time to work but to monitor his blood pressure and symptoms closely at home.  At this time, I do not feel there is any life-threatening condition present. I have reviewed and discussed all results (EKG, imaging, lab, urine as appropriate) and exam findings with patient/family. I have reviewed  nursing notes and appropriate previous records.  I feel the patient is safe to be discharged home without further emergent workup and can continue workup as an outpatient as needed. Discussed usual and customary return precautions. Patient/family verbalize understanding and are comfortable with this plan.  Outpatient follow-up has been provided if needed. All questions have been answered.    EKG Interpretation  Date/Time:  Thursday May 11 2017 19:23:11 EDT Ventricular Rate:  76 PR Interval:  146 QRS Duration: 80 QT Interval:  396 QTC Calculation: 445 R Axis:   47 Text Interpretation:  Sinus rhythm with Premature atrial complexes Otherwise normal ECG No significant change since last tracing Confirmed by Erendida Wrenn, Baxter Hire [phone]) on 05/11/2017 11:25:58 PM           Alicia Ackert, Layla Maw, DO 05/12/17 [id number]

## 2017-05-12 NOTE — Discharge Instructions (Signed)
Please continue your lisinopril and carvedilol as prescribed.  Please follow-up with your primary care physician in 1 to 2 weeks for reevaluation.

## 2017-05-29 ENCOUNTER — Ambulatory Visit: Payer: BLUE CROSS/BLUE SHIELD | Admitting: Neurology

## 2017-05-29 ENCOUNTER — Other Ambulatory Visit: Payer: Self-pay | Admitting: *Deleted

## 2017-05-29 ENCOUNTER — Encounter: Payer: Self-pay | Admitting: Neurology

## 2017-05-29 VITALS — BP 151/72 | HR 73 | Ht 69.0 in | Wt 237.0 lb

## 2017-05-29 DIAGNOSIS — R7309 Other abnormal glucose: Secondary | ICD-10-CM | POA: Diagnosis not present

## 2017-05-29 DIAGNOSIS — G459 Transient cerebral ischemic attack, unspecified: Secondary | ICD-10-CM | POA: Insufficient documentation

## 2017-05-29 DIAGNOSIS — R799 Abnormal finding of blood chemistry, unspecified: Secondary | ICD-10-CM | POA: Diagnosis not present

## 2017-05-29 DIAGNOSIS — G4733 Obstructive sleep apnea (adult) (pediatric): Secondary | ICD-10-CM | POA: Diagnosis not present

## 2017-05-29 DIAGNOSIS — R52 Pain, unspecified: Secondary | ICD-10-CM | POA: Diagnosis not present

## 2017-05-29 DIAGNOSIS — E538 Deficiency of other specified B group vitamins: Secondary | ICD-10-CM | POA: Diagnosis not present

## 2017-05-29 NOTE — Progress Notes (Signed)
PATIENT: Chad Weeks DOB: 1955-03-02  Chief Complaint  Patient presents with  . Migraine    Reports having a headache, along with numbness in the first three fingers of his left hand, left side of his face and slurred speech.  He was seen by his PCP the first time.  He had a second event of headache, left facial numbness and slurred speech.  He went to the ED this time.  His headaches are starting to lessen since he has started a new BP medication.  At times, he has noticed differences in his BP from left to right arm.  Right arm: BP: 151/72, P: 73.  Left arm: BP: 148/76, P: 70.  Marland Kitchen PCP    Daisy Floro, MD     HISTORICAL  Chad Weeks is a 62 years old male seen in refer by  his primary care physician  Dr Tenny Craw, [facility] Round for evaluation of frequent headaches, initial evaluation was on May 2019  I have reviewed and summarized the referring note, he has history of hypertension, hyperlipidemia, DM diverticulitis, obesity  In November 2018, he has developed cough, become chronic, was treated with antibiotics, concurrent with that, he also had elevated blood pressure despite compliant with his lisinopril at home, at the doctor's office it can go up to 190/100, at home, often the reading is around 160 over 80s,  Around that time, he began to develop bilateral frontal pressure headaches, the degree of headache that seems to correlate the elevation of his blood pressure,  On May 05, 2017, he was at [place] with his wife, that he had sudden onset of left hand numbness, also left facial tongue numbness with dysarthria, mild clumsiness of his left hand, lasting for 15 minutes, paramedic was called, at that time, his symptoms has resolved, he did not go to the hospital.  On May 10, 2017, he was started on beta-blocker Coreg at lower dose, next day on May 11, 2017, he had another similar episode of sudden onset slurred speech, left face and tongue numbness, lasting for 15 minutes, he was  evaluated at the emergency room, CT head without contrast showed mild atrophy, supratentorium small vessel disease, there was no acute pathology.  Labs May 11 2017: Hg 12.7 CMP glucose 141, troponin negative.  He is not on titrating dose of Coreg, blood pressure is under much better control now, today is 150/70, with heart rate of 73, his headache overall has much improved, but over the past few months, he has been taking Advil almost on a daily basis, 4 to 10 tablets each day,  He had a history of right cervical radiculopathy, had cervical decompression in the past, which has been helpful,  A1c was 7.7 in January 2019 He does has obesity, loud snoring, frequent awakening at nighttime, excessive daytime sleepiness, suggestive of obstructive sleep apnea   REVIEW OF SYSTEMS: Full 14 system review of systems performed and notable only for numbness, slurred speech, decreased energy, cough, eye pain, allergy, chest pain,  ALLERGIES: No Known Allergies  HOME MEDICATIONS: Current Outpatient Medications  Medication Sig Dispense Refill  . albuterol (PROAIR HFA) 108 (90 Base) MCG/ACT inhaler Inhale 2 puffs into the lungs every 6 (six) hours as needed for wheezing or shortness of breath.    Marland Kitchen atorvastatin (LIPITOR) 20 MG tablet Take 20 mg by mouth daily.    . carvedilol (COREG) 6.25 MG tablet Take 6.25 mg by mouth 2 (two) times daily with a meal.    .  cetirizine (ZYRTEC) 10 MG tablet Take 10 mg by mouth daily.    Marland Kitchen glipiZIDE (GLUCOTROL XL) 10 MG 24 hr tablet Take 10 mg by mouth 2 (two) times daily.    Marland Kitchen ibuprofen (ADVIL,MOTRIN) 200 MG tablet Take 800 mg by mouth every 6 (six) hours as needed (for headaches).    Marland Kitchen lisinopril (PRINIVIL,ZESTRIL) 40 MG tablet Take 40 mg by mouth daily.    . metFORMIN (GLUCOPHAGE-XR) 500 MG 24 hr tablet Take 1,000 mg by mouth 2 (two) times daily.    Marland Kitchen omeprazole (PRILOSEC) 40 MG capsule Take 40 mg by mouth daily as needed (for heartburn).    . pioglitazone (ACTOS) 45 MG  tablet Take 45 mg by mouth at bedtime.     No current facility-administered medications for this visit.     PAST MEDICAL HISTORY: Past Medical History:  Diagnosis Date  . Diabetes mellitus without complication (HCC)   . Foreign body (FB) in soft tissue    left ring finger  . GERD (gastroesophageal reflux disease)   . Hyperlipidemia   . Hypertension     PAST SURGICAL HISTORY: Past Surgical History:  Procedure Laterality Date  . CERVICAL FUSION    . INCISION AND DRAINAGE WOUND WITH FOREIGN BODY REMOVAL Left 02/23/2017   Bier Block. Procedure: INCISION AND DRAINAGE WOUND LEFT RING FINGER WITH FOREIGN BODY REMOVAL;  Surgeon: Cindee Salt, MD;  Location: [facility];  Service: Orthopedics;  Laterality: Left;  . TONSILLECTOMY      FAMILY HISTORY: Family History  Problem Relation Age of Onset  . Hypertension Father     SOCIAL HISTORY:  Social History   Socioeconomic History  . Marital status: Married    Spouse name: Not on file  . Number of children: 2  . Years of education: 16  . Highest education level: Bachelor's degree (e.g., BA, AB, BS)  Occupational History  . Occupation: VP of operations  Social Needs  . Financial resource strain: Not on file  . Food insecurity:    Worry: Not on file    Inability: Not on file  . Transportation needs:    Medical: Not on file    Non-medical: Not on file  Tobacco Use  . Smoking status: Never Smoker  . Smokeless tobacco: Never Used  Substance and Sexual Activity  . Alcohol use: Yes    Comment: 1-2 drinks per day  . Drug use: No  . Sexual activity: Not on file  Lifestyle  . Physical activity:    Days per week: Not on file    Minutes per session: Not on file  . Stress: Not on file  Relationships  . Social connections:    Talks on phone: Not on file    Gets together: Not on file    Attends religious service: Not on file    Active member of club or organization: Not on file    Attends meetings of clubs or  organizations: Not on file    Relationship status: Not on file  . Intimate partner violence:    Fear of current or ex partner: Not on file    Emotionally abused: Not on file    Physically abused: Not on file    Forced sexual activity: Not on file  Other Topics Concern  . Not on file  Social History Narrative   Lives at home with his wife.   Right-handed.   3-4 cups caffeine per day.     PHYSICAL EXAM   Vitals:  05/29/17 1342  BP: (!) 151/72  Pulse: 73  Weight: 237 lb (107.5 kg)  Height:  (1.753 m)    Not recorded      Body mass index is 35 kg/m.  PHYSICAL EXAMNIATION:  Gen: NAD, conversant, well nourised, obese, well groomed                     Cardiovascular: Regular rate rhythm, no peripheral edema, warm, nontender. Eyes: Conjunctivae clear without exudates or hemorrhage Neck: Supple, no carotid bruits. Pulmonary: Clear to auscultation bilaterally   NEUROLOGICAL EXAM:  MENTAL STATUS: Speech:    Speech is normal; fluent and spontaneous with normal comprehension.  Cognition:     Orientation to time, place and person     Normal recent and remote memory     Normal Attention span and concentration     Normal Language, naming, repeating,spontaneous speech     Fund of knowledge   CRANIAL NERVES: CN II: Visual fields are full to confrontation. Fundoscopic exam is normal with sharp discs and no vascular changes. Pupils are round equal and briskly reactive to light. CN III, IV, VI: extraocular movement are normal. No ptosis. CN V: Facial sensation is intact to pinprick in all 3 divisions bilaterally. Corneal responses are intact.  CN VII: Face is symmetric with normal eye closure and smile. CN VIII: Hearing is normal to rubbing fingers CN IX, X: Palate elevates symmetrically. Phonation is normal. CN XI: Head turning and shoulder shrug are intact CN XII: Tongue is midline with normal movements and no atrophy.  MOTOR: There is no pronator drift of  out-stretched arms. Muscle bulk and tone are normal. Muscle strength is normal.  REFLEXES: Reflexes are 2+ and symmetric at the biceps, triceps, knees, and ankles. Plantar responses are flexor.  SENSORY: Intact to light touch, pinprick, positional sensation and vibratory sensation are intact in fingers and toes.  COORDINATION: Rapid alternating movements and fine finger movements are intact. There is no dysmetria on finger-to-nose and heel-knee-shin.    GAIT/STANCE: Posture is normal. Gait is steady with normal steps, base, arm swing, and turning. Heel and toe walking are normal. Tandem gait is normal.  Romberg is absent.   DIAGNOSTIC DATA (LABS, IMAGING, TESTING) - I reviewed patient records, labs, notes, testing and imaging myself where available.   ASSESSMENT AND PLAN  Chad Weeks is a 62 y.o. male   TIA  Most consistent with right thalamic small vessel event  Complete evaluation with MRI, MRA of the brain and neck  Echocardiogram  He has vascular risk factor of hypertension, diabetes, hyperlipidemia, obesity, likely obstructive sleep apnea  Start aspirin 81 mg daily  Obstructive sleep apnea  Refer him to sleep study   Levert Feinstein, M.D. Ph.D.  [facility] [address] [place], [place] [address] Ph: [phone] Fax: [phone]  CC: Daisy Floro, MD

## 2017-05-30 ENCOUNTER — Telehealth: Payer: Self-pay | Admitting: Neurology

## 2017-05-30 LAB — VITAMIN B12: Vitamin B-12: 253 pg/mL (ref 232–1245)

## 2017-05-30 LAB — TSH: TSH: 1.46 u[IU]/mL (ref 0.450–4.500)

## 2017-05-30 LAB — HIV ANTIBODY (ROUTINE TESTING W REFLEX): HIV Screen 4th Generation wRfx: NONREACTIVE

## 2017-05-30 LAB — C-REACTIVE PROTEIN: CRP: 0.9 mg/L (ref 0.0–4.9)

## 2017-05-30 LAB — HGB A1C W/O EAG: Hgb A1c MFr Bld: 6.4 % — ABNORMAL HIGH (ref 4.8–5.6)

## 2017-05-30 LAB — RPR: RPR Ser Ql: NONREACTIVE

## 2017-05-30 LAB — SEDIMENTATION RATE: Sed Rate: 2 mm/hr (ref 0–30)

## 2017-05-30 NOTE — Telephone Encounter (Signed)
[employer] Auth: [id number] (exp. 05/30/17 to 06/28/17)  lvm for pt to call back about scheduling mri

## 2017-05-30 NOTE — Telephone Encounter (Signed)
Patient returned my call he is scheduled for 05/31/17 on the [facility] mobile unit.

## 2017-05-30 NOTE — Telephone Encounter (Signed)
Please call patient, laboratory evaluation showed mildly elevated A1c 6.4, consistent with his diagnosis of diabetes, rest of the laboratory evaluations were within normal limits.

## 2017-05-30 NOTE — Telephone Encounter (Signed)
Spoke to patient he is aware of results.

## 2017-05-31 ENCOUNTER — Ambulatory Visit: Payer: BLUE CROSS/BLUE SHIELD

## 2017-05-31 DIAGNOSIS — G4733 Obstructive sleep apnea (adult) (pediatric): Secondary | ICD-10-CM

## 2017-05-31 DIAGNOSIS — G459 Transient cerebral ischemic attack, unspecified: Secondary | ICD-10-CM

## 2017-05-31 MED ORDER — GADOPENTETATE DIMEGLUMINE 469.01 MG/ML IV SOLN
20.0000 mL | Freq: Once | INTRAVENOUS | Status: AC | PRN
  Administered 2017-05-31: 20 mL via INTRAVENOUS

## 2017-06-01 ENCOUNTER — Telehealth (HOSPITAL_COMMUNITY): Payer: Self-pay | Admitting: Neurology

## 2017-06-01 NOTE — Telephone Encounter (Signed)
Close encouter

## 2017-06-02 ENCOUNTER — Other Ambulatory Visit: Payer: Self-pay

## 2017-06-02 ENCOUNTER — Ambulatory Visit (HOSPITAL_COMMUNITY): Payer: BLUE CROSS/BLUE SHIELD | Attending: Cardiology

## 2017-06-02 DIAGNOSIS — E119 Type 2 diabetes mellitus without complications: Secondary | ICD-10-CM | POA: Insufficient documentation

## 2017-06-02 DIAGNOSIS — E785 Hyperlipidemia, unspecified: Secondary | ICD-10-CM | POA: Insufficient documentation

## 2017-06-02 DIAGNOSIS — Z6835 Body mass index (BMI) 35.0-35.9, adult: Secondary | ICD-10-CM | POA: Diagnosis not present

## 2017-06-02 DIAGNOSIS — E669 Obesity, unspecified: Secondary | ICD-10-CM | POA: Diagnosis not present

## 2017-06-02 DIAGNOSIS — I119 Hypertensive heart disease without heart failure: Secondary | ICD-10-CM | POA: Diagnosis not present

## 2017-06-02 DIAGNOSIS — G4733 Obstructive sleep apnea (adult) (pediatric): Secondary | ICD-10-CM | POA: Diagnosis not present

## 2017-06-02 DIAGNOSIS — G459 Transient cerebral ischemic attack, unspecified: Secondary | ICD-10-CM

## 2017-06-06 ENCOUNTER — Telehealth: Payer: Self-pay | Admitting: Neurology

## 2017-06-06 DIAGNOSIS — I679 Cerebrovascular disease, unspecified: Secondary | ICD-10-CM | POA: Diagnosis not present

## 2017-06-06 DIAGNOSIS — E1165 Type 2 diabetes mellitus with hyperglycemia: Secondary | ICD-10-CM | POA: Diagnosis not present

## 2017-06-06 NOTE — Telephone Encounter (Signed)
Patient calling to get MRI results. °

## 2017-06-06 NOTE — Telephone Encounter (Signed)
Please call patient, MRI of the brain showed mild age-related changes no acute abnormality, MRA of the brain and neck showed no significant large vessel disease.  IMPRESSION: This MR angiogram of the intracranial arteries shows the following: 1.    No intracranial stenosis is noted. 2.    Normal variant anatomy with a dominant left vertebral artery and a fetal origin of the right posterior cerebral artery. 3.    No aneurysms were noted.  IMPRESSION: This MR angiogram of the neck arteries shows minimal stenosis at the origin of the common carotid arteries and the right external carotid artery.  This is not hemodynamically significant.  IMPRESSION: This MRI of the brain without contrast shows the following: 1.   Mild generalized cortical atrophy. 2.   Mild chronic microvascular ischemic changes. 3.   Mild effusion in the left mastoid.  This is likely due to mild eustachian tube dysfunction.   4.   There are no acute findings.

## 2017-06-06 NOTE — Telephone Encounter (Signed)
Spoke to patient - he is aware of results and verbalized understanding.   

## 2017-06-06 NOTE — Telephone Encounter (Addendum)
Per Dr. Terrace Arabia, ECHO showed no significant abnormalities.  He should continue taking aspirin 81 mg daily, stay well hydrated and keep his pending follow up appointments.  He will be traveling to [place] on vacation soon but states this will not interfere with his appts.

## 2017-06-07 ENCOUNTER — Other Ambulatory Visit (HOSPITAL_COMMUNITY): Payer: BLUE CROSS/BLUE SHIELD

## 2017-07-25 ENCOUNTER — Institutional Professional Consult (permissible substitution): Payer: BLUE CROSS/BLUE SHIELD | Admitting: Neurology

## 2017-08-03 ENCOUNTER — Ambulatory Visit: Payer: BLUE CROSS/BLUE SHIELD | Admitting: Neurology

## 2017-08-03 ENCOUNTER — Encounter: Payer: Self-pay | Admitting: Neurology

## 2017-08-03 VITALS — BP 156/81 | HR 66 | Ht 69.0 in | Wt 226.0 lb

## 2017-08-03 DIAGNOSIS — G459 Transient cerebral ischemic attack, unspecified: Secondary | ICD-10-CM

## 2017-08-03 NOTE — Progress Notes (Signed)
PATIENT: Chad Weeks DOB: [birth year]  Chief Complaint  Patient presents with  . Transient Ischemic Attack    He would like to review his ECHO, MRI and MRA scans.  He has continued taking aspirin 81mg  daily.  His blood pressure is getting under better control.     HISTORICAL  Chad Weeks is a 62 years old male seen in refer by  his primary care physician  Dr Tenny Crawoss, Darlen Roundharles Alan for evaluation of frequent headaches, initial evaluation was on May 2019  I have reviewed and summarized the referring note, he has history of hypertension, hyperlipidemia, DM diverticulitis, obesity  In November 2018, he has developed cough, become chronic, was treated with antibiotics, concurrent with that, he also had elevated blood pressure despite compliant with his lisinopril at home, at the doctor's office it can go up to 190/100, at home, often the reading is around 160 over 80s,  Around that time, he began to develop bilateral frontal pressure headaches, the degree of headache that seems to correlate the elevation of his blood pressure,  On May 05, 2017, he was at [place] with [place], that he had sudden onset of left hand numbness, also left facial tongue numbness with dysarthria, mild clumsiness of his left hand, lasting for 15 minutes, paramedic was called, at that time, his symptoms has resolved, he did not go to the hospital.  On May 10, 2017, he was started on beta-blocker Coreg at lower dose, next day on May 11, 2017, he had another similar episode of sudden onset slurred speech, left face and tongue numbness, lasting for 15 minutes, he was evaluated at the emergency room, CT head without contrast showed mild atrophy, supratentorium small vessel disease, there was no acute pathology.  Labs May 11 2017: Hg 12.7 CMP glucose 141, troponin negative.  He is not on titrating dose of Coreg, blood pressure is under much better control now, today is 150/70, with heart rate of 73, his headache overall has  much improved, but over the past few months, he has been taking Advil almost on a daily basis, 4 to 10 tablets each day,  He had a history of right cervical radiculopathy, had cervical decompression in the past, which has been helpful,  A1c was 7.7 in January 2019 He does has obesity, loud snoring, frequent awakening at nighttime, excessive daytime sleepiness, suggestive of obstructive sleep apnea  UPDATE August 03 2017: MRI of the brain showed mild generalized atrophy, small vessel disease, there is no acute abnormality.  MRA of the brain showed normal anatomic variation no large vessel disease. I personally reviewed MRA of neck, minimum stenosis at origin of the common carotid artery, right external carotid arteries,   Echocardiogram on Jun 02, 2017, showed ejection fraction of 55 to 60%, wall motion was normal.  No recurrent episode of left and left leg paresthesia, blood pressure is under better control 130/88, He has been taking his aspirin daily, lost 10 pounds over the past few weeks   REVIEW OF SYSTEMS: Full 14 system review of systems performed and notable only for eye redness  ALLERGIES: No Known Allergies  HOME MEDICATIONS: Current Outpatient Medications  Medication Sig Dispense Refill  . albuterol (PROAIR HFA) 108 (90 Base) MCG/ACT inhaler Inhale 2 puffs into the lungs every 6 (six) hours as needed for wheezing or shortness of breath.    Marland Kitchen. aspirin EC 81 MG tablet Take 81 mg by mouth daily.    Marland Kitchen. atorvastatin (LIPITOR) 20 MG  tablet Take 20 mg by mouth daily.    . carvedilol (COREG) 25 MG tablet Take 25 mg by mouth 2 (two) times daily.  2  . carvedilol (COREG) 6.25 MG tablet Take 6.25 mg by mouth 2 (two) times daily with a meal.    . cetirizine (ZYRTEC) 10 MG tablet Take 10 mg by mouth daily.    Marland Kitchen glipiZIDE (GLUCOTROL XL) 10 MG 24 hr tablet Take 10 mg by mouth 2 (two) times daily.    Marland Kitchen ibuprofen (ADVIL,MOTRIN) 200 MG tablet Take 800 mg by mouth every 6 (six) hours as needed  (for headaches).    . JARDIANCE 10 MG TABS tablet Take 10 mg by mouth daily.  0  . lisinopril (PRINIVIL,ZESTRIL) 40 MG tablet Take 40 mg by mouth daily.    . metFORMIN (GLUCOPHAGE-XR) 500 MG 24 hr tablet Take 1,000 mg by mouth 2 (two) times daily.    Marland Kitchen omeprazole (PRILOSEC) 40 MG capsule Take 40 mg by mouth daily as needed (for heartburn).    . pioglitazone (ACTOS) 45 MG tablet Take 45 mg by mouth at bedtime.     No current facility-administered medications for this visit.     PAST MEDICAL HISTORY: Past Medical History:  Diagnosis Date  . Diabetes mellitus without complication (HCC)   . Foreign body (FB) in soft tissue    left ring finger  . GERD (gastroesophageal reflux disease)   . Hyperlipidemia   . Hypertension     PAST SURGICAL HISTORY: Past Surgical History:  Procedure Laterality Date  . CERVICAL FUSION    . INCISION AND DRAINAGE WOUND WITH FOREIGN BODY REMOVAL Left 02/23/2017   Bier Block. Procedure: INCISION AND DRAINAGE WOUND LEFT RING FINGER WITH FOREIGN BODY REMOVAL;  Surgeon: Cindee Salt, MD;  Location: [facility];  Service: Orthopedics;  Laterality: Left;  . TONSILLECTOMY      FAMILY HISTORY: Family History  Problem Relation Age of Onset  . Hypertension Father     SOCIAL HISTORY:  Social History   Socioeconomic History  . Marital status: Married    Spouse name: Not on file  . Number of children: 2  . Years of education: 16  . Highest education level: Bachelor's degree (e.g., BA, AB, BS)  Occupational History  . Occupation: VP of operations  Social Needs  . Financial resource strain: Not on file  . Food insecurity:    Worry: Not on file    Inability: Not on file  . Transportation needs:    Medical: Not on file    Non-medical: Not on file  Tobacco Use  . Smoking status: Never Smoker  . Smokeless tobacco: Never Used  Substance and Sexual Activity  . Alcohol use: Yes    Comment: 1-2 drinks per day  . Drug use: No  . Sexual  activity: Not on file  Lifestyle  . Physical activity:    Days per week: Not on file    Minutes per session: Not on file  . Stress: Not on file  Relationships  . Social connections:    Talks on phone: Not on file    Gets together: Not on file    Attends religious service: Not on file    Active member of club or organization: Not on file    Attends meetings of clubs or organizations: Not on file    Relationship status: Not on file  . Intimate partner violence:    Fear of current or ex partner: Not on file  Emotionally abused: Not on file    Physically abused: Not on file    Forced sexual activity: Not on file  Other Topics Concern  . Not on file  Social History Narrative   Lives at home with his wife.   Right-handed.   3-4 cups caffeine per day.     PHYSICAL EXAM   Vitals:   08/03/17 1550  Weight: 226 lb (102.5 kg)  Height: 5\' 9"  (1.753 m)    Not recorded      Body mass index is 33.37 kg/m.  PHYSICAL EXAMNIATION:  Gen: NAD, conversant, well nourised, obese, well groomed                     Cardiovascular: Regular rate rhythm, no peripheral edema, warm, nontender. Eyes: Conjunctivae clear without exudates or hemorrhage Neck: Supple, no carotid bruits. Pulmonary: Clear to auscultation bilaterally   NEUROLOGICAL EXAM:  MENTAL STATUS: Speech:    Speech is normal; fluent and spontaneous with normal comprehension.  Cognition:     Orientation to time, place and person     Normal recent and remote memory     Normal Attention span and concentration     Normal Language, naming, repeating,spontaneous speech     Fund of knowledge   CRANIAL NERVES: CN II: Visual fields are full to confrontation. Fundoscopic exam is normal with sharp discs and no vascular changes. Pupils are round equal and briskly reactive to light. CN III, IV, VI: extraocular movement are normal. No ptosis. CN V: Facial sensation is intact to pinprick in all 3 divisions bilaterally. Corneal  responses are intact.  CN VII: Face is symmetric with normal eye closure and smile. CN VIII: Hearing is normal to rubbing fingers CN IX, X: Palate elevates symmetrically. Phonation is normal. CN XI: Head turning and shoulder shrug are intact CN XII: Tongue is midline with normal movements and no atrophy.  MOTOR: There is no pronator drift of out-stretched arms. Muscle bulk and tone are normal. Muscle strength is normal.  REFLEXES: Reflexes are 2+ and symmetric at the biceps, triceps, knees, and ankles. Plantar responses are flexor.  SENSORY: Intact to light touch, pinprick, positional sensation and vibratory sensation are intact in fingers and toes.  COORDINATION: Rapid alternating movements and fine finger movements are intact. There is no dysmetria on finger-to-nose and heel-knee-shin.    GAIT/STANCE: Posture is normal. Gait is steady with normal steps, base, arm swing, and turning. Heel and toe walking are normal. Tandem gait is normal.  Romberg is absent.   DIAGNOSTIC DATA (LABS, IMAGING, TESTING) - I reviewed patient records, labs, notes, testing and imaging myself where available.   ASSESSMENT AND PLAN  KINAN SAFLEY is a 62 y.o. male   Possible transient ischemic event   He has vascular risk factor of hypertension, diabetes, hyperlipidemia, obesity, likely obstructive sleep apnea  Continue aspirin 81 mg daily  Emphasized importance of moderate exercise, weight loss  Obstructive sleep apnea  Sleep consultation pending   Levert Feinstein, M.D. Ph.D.  [facility] [address], [address] [place], [place] [address] Ph: [phone] Fax: [phone]  CC: Daisy Floro, MD

## 2017-08-10 ENCOUNTER — Encounter: Payer: Self-pay | Admitting: Neurology

## 2017-08-10 ENCOUNTER — Ambulatory Visit (INDEPENDENT_AMBULATORY_CARE_PROVIDER_SITE_OTHER): Payer: BLUE CROSS/BLUE SHIELD | Admitting: Neurology

## 2017-08-10 VITALS — BP 144/79 | HR 73 | Ht 69.0 in | Wt 231.0 lb

## 2017-08-10 DIAGNOSIS — R0683 Snoring: Secondary | ICD-10-CM | POA: Diagnosis not present

## 2017-08-10 DIAGNOSIS — R351 Nocturia: Secondary | ICD-10-CM

## 2017-08-10 DIAGNOSIS — R51 Headache: Secondary | ICD-10-CM

## 2017-08-10 DIAGNOSIS — R519 Headache, unspecified: Secondary | ICD-10-CM

## 2017-08-10 DIAGNOSIS — E669 Obesity, unspecified: Secondary | ICD-10-CM | POA: Diagnosis not present

## 2017-08-10 DIAGNOSIS — G459 Transient cerebral ischemic attack, unspecified: Secondary | ICD-10-CM | POA: Diagnosis not present

## 2017-08-10 NOTE — Progress Notes (Signed)
Subjective:    Patient ID: Chad Weeks is a 62 y.o. male.  HPI     Chad Foley, MD, PhD [facility] [address] [address] [place], [place] [address]  Dear Chad Weeks,   I saw your patient, Chad Weeks, upon your kind request in my clinic today for initial consultation of his sleep disorder, in particular, evaluation for obstructive sleep apnea. The patient is unaccompanied today. As you know, Mr. Chad Weeks is a 62 year old right-handed gentleman with an underlying medical history of reflux disease, hypertension, hyperlipidemia, diabetes, history of TIA and obesity, who reports snoring and excessive daytime somnolence. I reviewed your office note from 08/03/2017. His Epworth sleepiness score is 7 out of 24, fatigue score is 18 out of 63. He is married and lives with his wife, they have 3 children. He is a nonsmoker and drinks alcohol 3-5 drinks per week on average, caffeine in the form of coffee, 3-4 cups per day on average. He is typically in that between 9 and 10, rice time between 6 and 7, he has nocturia about once per average night, he has had morning headaches before. They have 1 dog, kids are grown, 3 grandkids, fourth on the way. He denies telltale symptoms of restless legs syndrome, does not recall a family history of sleep apnea, had a tonsillectomy as a child, had neck surgery.   His Past Medical History Is Significant For: Past Medical History:  Diagnosis Date  . Diabetes mellitus without complication (HCC)   . Foreign body (FB) in soft tissue    left ring finger  . GERD (gastroesophageal reflux disease)   . Hyperlipidemia   . Hypertension     His Past Surgical History Is Significant For: Past Surgical History:  Procedure Laterality Date  . CERVICAL FUSION    . INCISION AND DRAINAGE WOUND WITH FOREIGN BODY REMOVAL Left 02/23/2017   Bier Block. Procedure: INCISION AND DRAINAGE WOUND LEFT RING FINGER WITH FOREIGN BODY REMOVAL;  Surgeon: Cindee Salt, MD;  Location: [facility];  Service: Orthopedics;  Laterality: Left;  . TONSILLECTOMY      His Family History Is Significant For: Family History  Problem Relation Age of Onset  . Hypertension Father     His Social History Is Significant For: Social History   Socioeconomic History  . Marital status: Married    Spouse name: Not on file  . Number of children: 2  . Years of education: 16  . Highest education level: Bachelor's degree (e.g., BA, AB, BS)  Occupational History  . Occupation: VP of operations  Social Needs  . Financial resource strain: Not on file  . Food insecurity:    Worry: Not on file    Inability: Not on file  . Transportation needs:    Medical: Not on file    Non-medical: Not on file  Tobacco Use  . Smoking status: Never Smoker  . Smokeless tobacco: Never Used  Substance and Sexual Activity  . Alcohol use: Yes    Comment: 1-2 drinks per day  . Drug use: No  . Sexual activity: Not on file  Lifestyle  . Physical activity:    Days per week: Not on file    Minutes per session: Not on file  . Stress: Not on file  Relationships  . Social connections:    Talks on phone: Not on file    Gets together: Not on file    Attends religious service: Not on file  Active member of club or organization: Not on file    Attends meetings of clubs or organizations: Not on file    Relationship status: Not on file  Other Topics Concern  . Not on file  Social History Narrative   Lives at home with his wife.   Right-handed.   3-4 cups caffeine per day.    His Allergies Are:  No Known Allergies:   His Current Medications Are:  Outpatient Encounter Medications as of 08/10/2017  Medication Sig  . albuterol (PROAIR HFA) 108 (90 Base) MCG/ACT inhaler Inhale 2 puffs into the lungs every 6 (six) hours as needed for wheezing or shortness of breath.  Marland Kitchen. aspirin EC 81 MG tablet Take 81 mg by mouth daily.  Marland Kitchen. atorvastatin (LIPITOR) 20 MG tablet Take 20 mg  by mouth daily.  . carvedilol (COREG) 25 MG tablet Take 25 mg by mouth 2 (two) times daily.  . cetirizine (ZYRTEC) 10 MG tablet Take 10 mg by mouth daily.  Marland Kitchen. glipiZIDE (GLUCOTROL XL) 10 MG 24 hr tablet Take 10 mg by mouth 2 (two) times daily.  Marland Kitchen. ibuprofen (ADVIL,MOTRIN) 200 MG tablet Take 800 mg by mouth every 6 (six) hours as needed (for headaches).  . JARDIANCE 10 MG TABS tablet Take 10 mg by mouth daily.  Marland Kitchen. lisinopril (PRINIVIL,ZESTRIL) 40 MG tablet Take 40 mg by mouth daily.  . metFORMIN (GLUCOPHAGE-XR) 500 MG 24 hr tablet Take 1,000 mg by mouth 2 (two) times daily.  Marland Kitchen. omeprazole (PRILOSEC) 40 MG capsule Take 40 mg by mouth daily as needed (for heartburn).  . pioglitazone (ACTOS) 45 MG tablet Take 45 mg by mouth at bedtime.  . [DISCONTINUED] carvedilol (COREG) 6.25 MG tablet Take 6.25 mg by mouth 2 (two) times daily with a meal.   No facility-administered encounter medications on file as of 08/10/2017.   :  Review of Systems:  Out of a complete 14 point review of systems, all are reviewed and negative with the exception of these symptoms as listed below: Review of Systems  Neurological:       Pt presents today to discuss his sleep. Pt has never had a sleep study but does endorse snoring.  Epworth Sleepiness Scale 0= would never doze 1= slight chance of dozing 2= moderate chance of dozing 3= high chance of dozing  Sitting and reading: 1 Watching TV: 2 Sitting inactive in a public place (ex. Theater or meeting): 0 As a passenger in a car for an hour without a break: 0 Lying down to rest in the afternoon: 3 Sitting and talking to someone: 0 Sitting quietly after lunch (no alcohol): 1 In a car, while stopped in traffic: 0 Total: 7     Objective:  Neurological Exam  Physical Exam Physical Examination:   Vitals:   08/10/17 1312  BP: (!) 144/79  Pulse: 73    General Examination: The patient is a very pleasant 62 y.o. male in no acute distress. He appears well-developed  and well-nourished and well groomed.   HEENT: Normocephalic, atraumatic, pupils are equal, round and reactive to light and accommodation. Keeps his eyes closed often. Unremarkable anterior neck scar. Speech seems slightly higher pitched at times and hoarse at times. Extraocular tracking is good without limitation to gaze excursion or nystagmus noted. Normal smooth pursuit is noted. Hearing is grossly intact. Face is symmetric with normal facial animation and normal facial sensation. Speech is clear with no dysarthria noted. There is no hypophonia. There is no lip, neck/head, jaw or  voice tremor. Neck is supple with full range of passive and active motion. There are no carotid bruits on auscultation. Oropharynx exam reveals: mild mouth dryness, adequate dental hygiene and moderate airway crowding, due to larger uvula, smaller airway entry. Mallampati is class II. Tongue protrudes centrally and palate elevates symmetrically. Tonsils are absent. Neck size is 18.5 inches. He has a Mild overbite. Nasal inspection reveals no significant nasal mucosal bogginess or redness and no septal deviation.   Chest: Clear to auscultation without wheezing, rhonchi or crackles noted.  Heart: S1+S2+0, regular and normal without murmurs, rubs or gallops noted.   Abdomen: Soft, non-tender and non-distended with normal bowel sounds appreciated on auscultation.  Extremities: There is no pitting edema in the distal lower extremities bilaterally.  Skin: Warm and dry without trophic changes noted.  Musculoskeletal: exam reveals no obvious joint deformities, tenderness or joint swelling or erythema.   Neurologically:  Mental status: The patient is awake, alert and oriented in all 4 spheres. His immediate and remote memory, attention, language skills and fund of knowledge are appropriate. There is no evidence of aphasia, agnosia, apraxia or anomia. Speech is clear with normal prosody and enunciation. Thought process is linear.  Mood is normal and affect is normal.  Cranial nerves II - XII are as described above under HEENT exam. In addition: shoulder shrug is normal with equal shoulder height noted. Motor exam: Normal bulk, strength and tone is noted. There is no drift, tremor or rebound. Romberg is negative. Fine motor skills and coordination: grossly intact.  Cerebellar testing: No dysmetria or intention tremor. There is no truncal or gait ataxia.  Sensory exam: intact to light touch in the upper and lower extremities.  Gait, station and balance: He stands easily. No veering to one side is noted. No leaning to one side is noted. Posture is age-appropriate and stance is narrow based. Gait shows normal stride length and normal pace. No problems turning are noted. Tandem walk is unremarkable.  Assessment and Plan:   In summary, Chad Weeks is a very pleasant 62 y.o.-year old male with an underlying medical history of reflux disease, hypertension, hyperlipidemia, diabetes, history of TIA and obesity, whose history and physical exam are concerning for obstructive sleep apnea (OSA).  I had a long chat with the patient about my findings and the diagnosis of OSA, its prognosis and treatment options. We talked about medical treatments, surgical interventions and non-pharmacological approaches. I explained in particular the risks and ramifications of untreated moderate to severe OSA, especially with respect to developing cardiovascular disease down the Road, including congestive heart failure, difficult to treat hypertension, cardiac arrhythmias, or stroke. Even type 2 diabetes has, in part, been linked to untreated OSA. Symptoms of untreated OSA include daytime sleepiness, memory problems, mood irritability and mood disorder such as depression and anxiety, lack of energy, as well as recurrent headaches, especially morning headaches. We talked about trying to maintain a healthy lifestyle in general, as well as the importance of weight  control. I encouraged the patient to eat healthy, exercise daily and keep well hydrated, to keep a scheduled bedtime and wake time routine, to not skip any meals and eat healthy snacks in between meals. I advised the patient not to drive when feeling sleepy. I recommended the following at this time: sleep study with potential positive airway pressure titration. (We will score hypopneas at 3%).   I explained the sleep test procedure to the patient and also outlined possible surgical and non-surgical treatment options  of OSA, including the use of a custom-made dental device (which would require a referral to a specialist dentist or oral surgeon), upper airway surgical options, such as pillar implants, radiofrequency surgery, tongue base surgery, and UPPP (which would involve a referral to an ENT surgeon). Rarely, jaw surgery such as mandibular advancement may be considered.  I also explained the CPAP treatment option to the patient, who indicated that he would be willing to try CPAP if the need arises. I explained the importance of being compliant with PAP treatment, not only for insurance purposes but primarily to improve His symptoms, and for the patient's long term health benefit, including to reduce His cardiovascular risks. I answered all his questions today and the patient was in agreement. I plan to see him back after the sleep study is completed and encouraged him to call with any interim questions, concerns, problems or updates.   Thank you very much for allowing me to participate in the care of this nice patient. If I can be of any further assistance to you please do not hesitate to talk to me.  Sincerely,   Chad Foley, MD, PhD

## 2017-08-10 NOTE — Patient Instructions (Signed)

## 2017-08-15 ENCOUNTER — Telehealth: Payer: Self-pay

## 2017-08-15 DIAGNOSIS — R0683 Snoring: Secondary | ICD-10-CM

## 2017-08-15 DIAGNOSIS — E669 Obesity, unspecified: Secondary | ICD-10-CM

## 2017-08-15 DIAGNOSIS — R51 Headache: Secondary | ICD-10-CM

## 2017-08-15 DIAGNOSIS — R519 Headache, unspecified: Secondary | ICD-10-CM

## 2017-08-15 DIAGNOSIS — R351 Nocturia: Secondary | ICD-10-CM

## 2017-08-15 DIAGNOSIS — G459 Transient cerebral ischemic attack, unspecified: Secondary | ICD-10-CM

## 2017-08-15 NOTE — Telephone Encounter (Signed)
Insurance has denied in lab sleep study request. Please order HST, thanks! 

## 2017-08-15 NOTE — Addendum Note (Signed)
Addended by: Huston FoleyATHAR, Shneur [place]itten[place] on: 08/15/2017 01:05 PM   Modules accepted: Orders

## 2017-08-15 NOTE — Telephone Encounter (Signed)
This patient's insurance denied an attended sleep study. I will order home sleep test.  

## 2017-08-16 DIAGNOSIS — E7800 Pure hypercholesterolemia, unspecified: Secondary | ICD-10-CM | POA: Diagnosis not present

## 2017-08-16 DIAGNOSIS — E1165 Type 2 diabetes mellitus with hyperglycemia: Secondary | ICD-10-CM | POA: Diagnosis not present

## 2017-08-16 DIAGNOSIS — I10 Essential (primary) hypertension: Secondary | ICD-10-CM | POA: Diagnosis not present

## 2017-08-16 DIAGNOSIS — Z0000 Encounter for general adult medical examination without abnormal findings: Secondary | ICD-10-CM | POA: Diagnosis not present

## 2017-08-16 DIAGNOSIS — K219 Gastro-esophageal reflux disease without esophagitis: Secondary | ICD-10-CM | POA: Diagnosis not present

## 2017-09-05 ENCOUNTER — Telehealth: Payer: Self-pay

## 2017-09-05 NOTE — Telephone Encounter (Signed)
We have attempted to call the patient two times to schedule sleep study.  Patient has been unavailable at the phone numbers we have on file and has not returned our calls.  At this point we will send a letter asking patient to please contact the sleep lab to schedule their sleep study.  If patient calls back we will schedule them for their sleep study. 

## 2017-09-08 DIAGNOSIS — Z125 Encounter for screening for malignant neoplasm of prostate: Secondary | ICD-10-CM | POA: Diagnosis not present

## 2017-09-08 DIAGNOSIS — E1165 Type 2 diabetes mellitus with hyperglycemia: Secondary | ICD-10-CM | POA: Diagnosis not present

## 2017-09-08 DIAGNOSIS — E1169 Type 2 diabetes mellitus with other specified complication: Secondary | ICD-10-CM | POA: Diagnosis not present

## 2017-10-18 DIAGNOSIS — Z23 Encounter for immunization: Secondary | ICD-10-CM | POA: Diagnosis not present

## 2017-12-01 DIAGNOSIS — S61219A Laceration without foreign body of unspecified finger without damage to nail, initial encounter: Secondary | ICD-10-CM | POA: Diagnosis not present

## 2017-12-01 DIAGNOSIS — Z23 Encounter for immunization: Secondary | ICD-10-CM | POA: Diagnosis not present

## 2017-12-06 DIAGNOSIS — H02029 Mechanical entropion of unspecified eye, unspecified eyelid: Secondary | ICD-10-CM | POA: Diagnosis not present

## 2017-12-06 DIAGNOSIS — H02015 Cicatricial entropion of left lower eyelid: Secondary | ICD-10-CM | POA: Diagnosis not present

## 2017-12-06 DIAGNOSIS — H02055 Trichiasis without entropian left lower eyelid: Secondary | ICD-10-CM | POA: Diagnosis not present

## 2017-12-06 DIAGNOSIS — H02052 Trichiasis without entropian right lower eyelid: Secondary | ICD-10-CM | POA: Diagnosis not present

## 2017-12-06 DIAGNOSIS — H02012 Cicatricial entropion of right lower eyelid: Secondary | ICD-10-CM | POA: Diagnosis not present

## 2018-02-22 DIAGNOSIS — L570 Actinic keratosis: Secondary | ICD-10-CM | POA: Diagnosis not present

## 2018-02-22 DIAGNOSIS — E1169 Type 2 diabetes mellitus with other specified complication: Secondary | ICD-10-CM | POA: Diagnosis not present

## 2018-06-11 DIAGNOSIS — J029 Acute pharyngitis, unspecified: Secondary | ICD-10-CM | POA: Diagnosis not present

## 2018-08-23 DIAGNOSIS — K219 Gastro-esophageal reflux disease without esophagitis: Secondary | ICD-10-CM | POA: Diagnosis not present

## 2018-08-23 DIAGNOSIS — E1169 Type 2 diabetes mellitus with other specified complication: Secondary | ICD-10-CM | POA: Diagnosis not present

## 2018-08-23 DIAGNOSIS — E7800 Pure hypercholesterolemia, unspecified: Secondary | ICD-10-CM | POA: Diagnosis not present

## 2018-08-23 DIAGNOSIS — I10 Essential (primary) hypertension: Secondary | ICD-10-CM | POA: Diagnosis not present

## 2018-10-04 DIAGNOSIS — Z23 Encounter for immunization: Secondary | ICD-10-CM | POA: Diagnosis not present

## 2018-10-04 DIAGNOSIS — I10 Essential (primary) hypertension: Secondary | ICD-10-CM | POA: Diagnosis not present

## 2018-10-04 DIAGNOSIS — E1169 Type 2 diabetes mellitus with other specified complication: Secondary | ICD-10-CM | POA: Diagnosis not present

## 2018-10-04 DIAGNOSIS — Z125 Encounter for screening for malignant neoplasm of prostate: Secondary | ICD-10-CM | POA: Diagnosis not present

## 2018-10-04 DIAGNOSIS — E7800 Pure hypercholesterolemia, unspecified: Secondary | ICD-10-CM | POA: Diagnosis not present

## 2018-11-23 DIAGNOSIS — Z1159 Encounter for screening for other viral diseases: Secondary | ICD-10-CM | POA: Diagnosis not present

## 2018-11-28 DIAGNOSIS — K621 Rectal polyp: Secondary | ICD-10-CM | POA: Diagnosis not present

## 2018-11-28 DIAGNOSIS — K635 Polyp of colon: Secondary | ICD-10-CM | POA: Diagnosis not present

## 2018-11-28 DIAGNOSIS — D128 Benign neoplasm of rectum: Secondary | ICD-10-CM | POA: Diagnosis not present

## 2018-11-28 DIAGNOSIS — K5730 Diverticulosis of large intestine without perforation or abscess without bleeding: Secondary | ICD-10-CM | POA: Diagnosis not present

## 2018-11-28 DIAGNOSIS — Z86010 Personal history of colonic polyps: Secondary | ICD-10-CM | POA: Diagnosis not present

## 2018-12-14 IMAGING — CT CT HEAD W/O CM
3 series · 14 of 47 positions shown, 16 images · non-contrast
Comparison: No comparison studies available.

CLINICAL DATA: Ongoing headache with hypertension.

EXAM:
CT HEAD WITHOUT CONTRAST
TECHNIQUE: Contiguous axial images were obtained from the base of the skull
through the vertex without intravenous contrast.

[Series 3: head 5.0 h30s · axial · 0.49mm/px · z∈[-95,+55]mm · 8 of 36 slices shown, 10 images]
[im 3/36  brain]
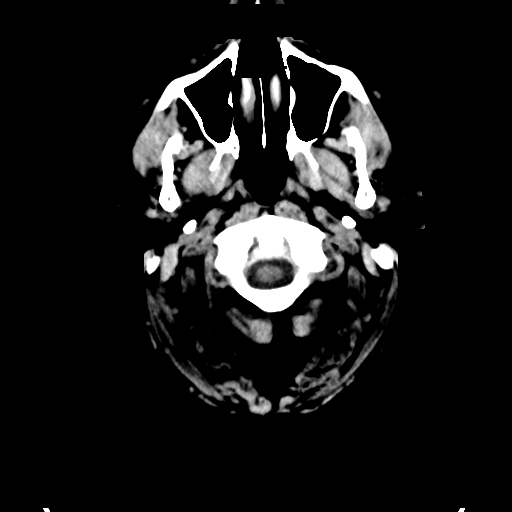
[im 3/36  bone]
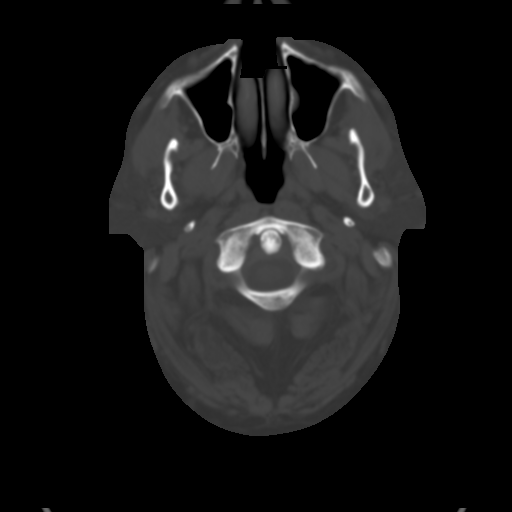
[im 8/36  brain]
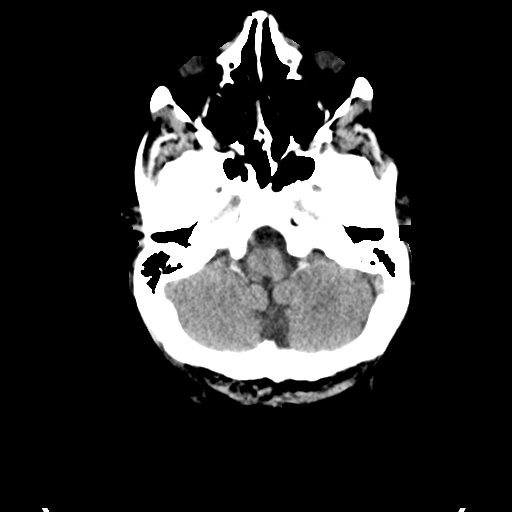
[im 11/36  brain]
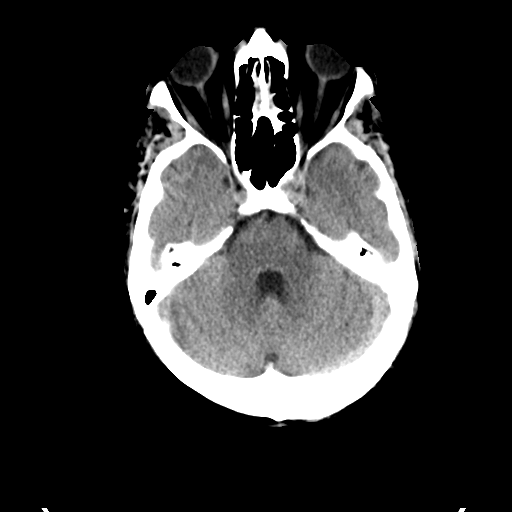
[im 16/36  brain]
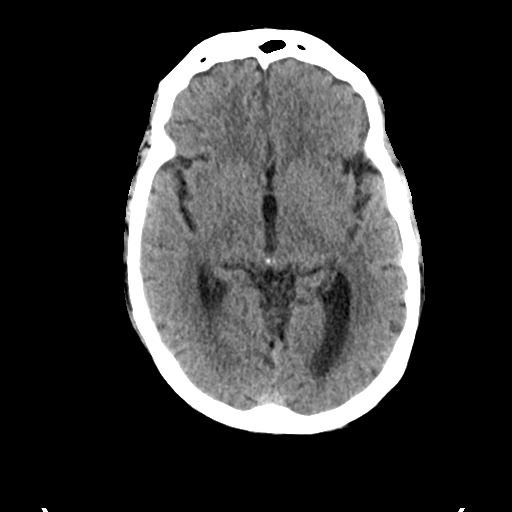
[im 20/36  brain]
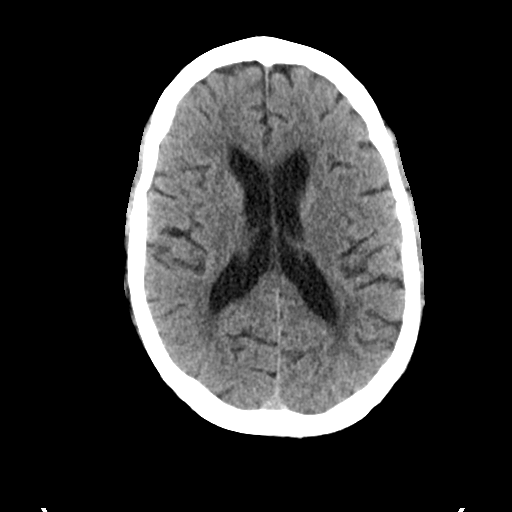
[im 20/36  bone]
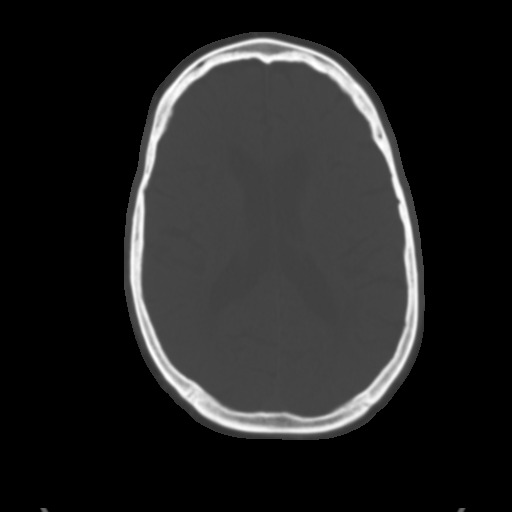
[im 25/36  brain]
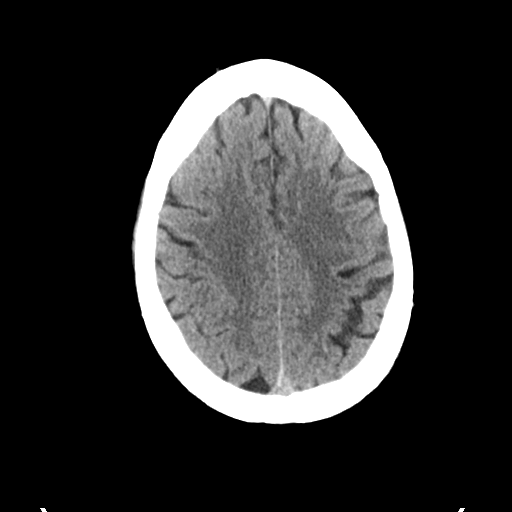
[im 28/36  brain]
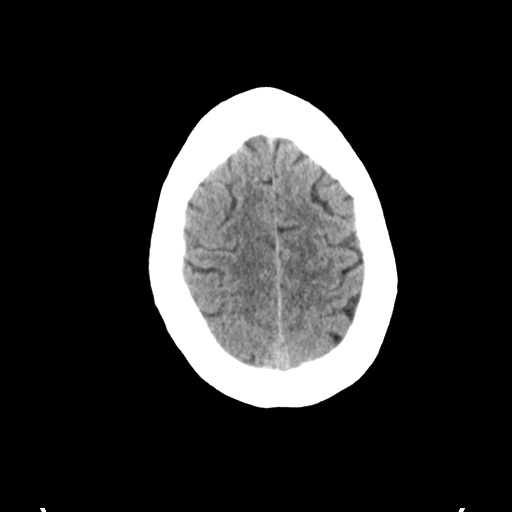
[im 33/36  brain]
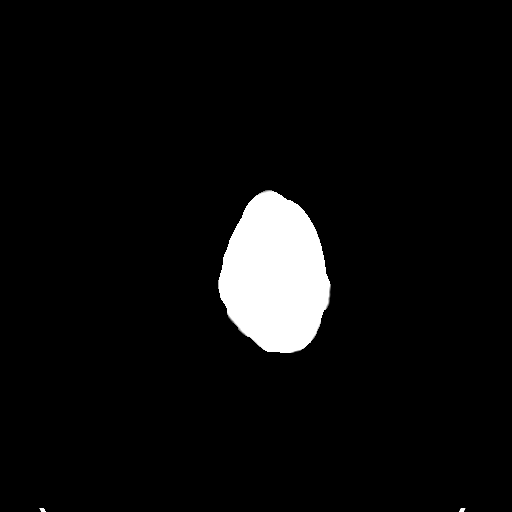

[Series 5: head 3.0 mpr cor · coronal · 0.35mm/px · 3 of 75 slices shown]
[im 25/75  brain]
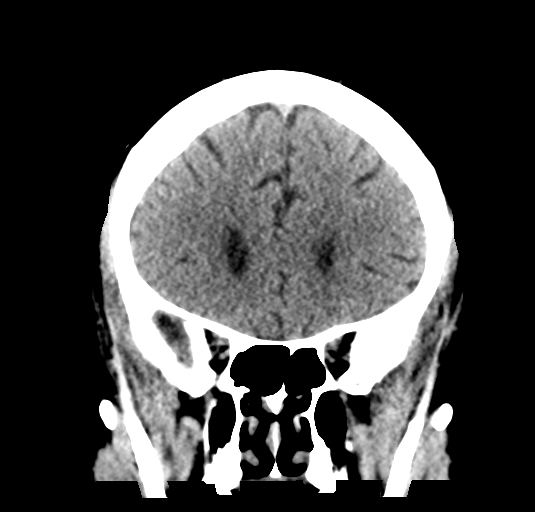
[im 33/75  brain]
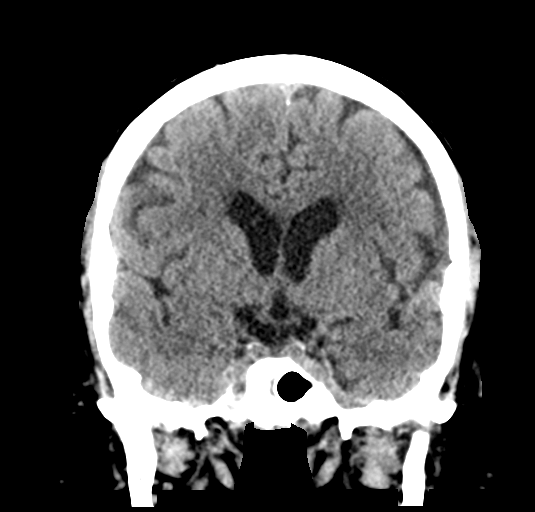
[im 42/75  brain]
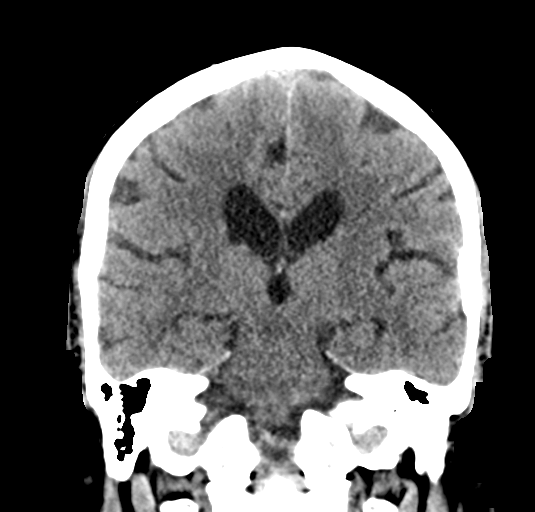

[Series 6: head 3.0 mpr sag · sagittal · 0.35mm/px · 3 of 67 slices shown]
[im 23/67  brain]
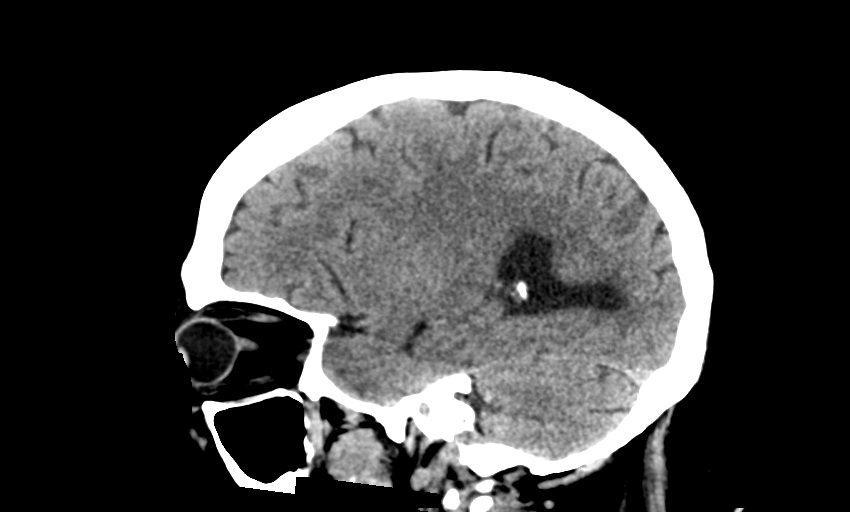
[im 34/67  brain]
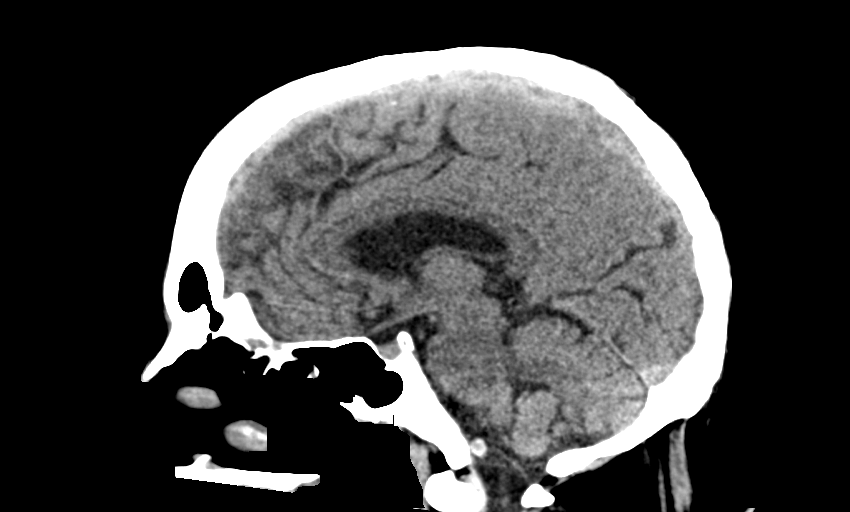
[im 45/67  brain]
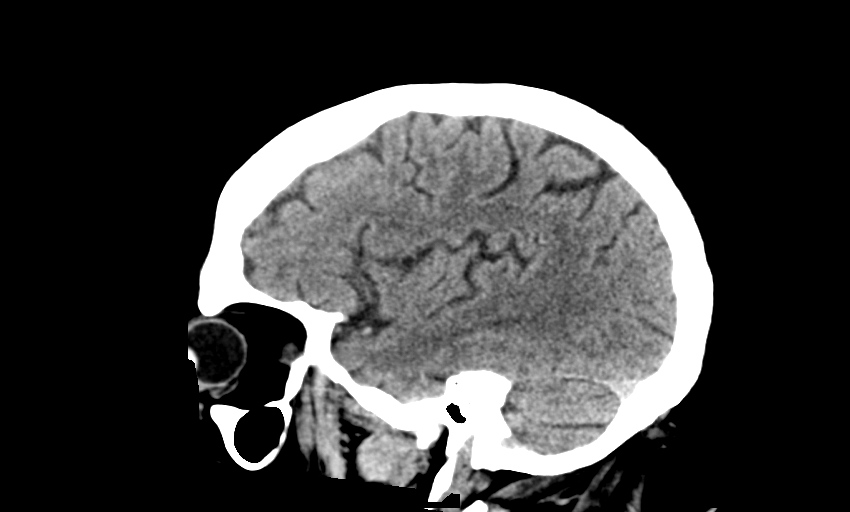

[14 of 47 positions shown; findings below may reference images not displayed]

FINDINGS: Brain: There is no evidence for acute hemorrhage, hydrocephalus,
mass lesion, or abnormal extra-axial fluid collection. No definite
CT evidence for acute infarction. Diffuse loss of parenchymal volume
is consistent with atrophy. Patchy low attenuation in the deep
hemispheric and periventricular white matter is nonspecific, but
likely reflects chronic microvascular ischemic demyelination.

Vascular: No hyperdense vessel or unexpected calcification.

Skull: No evidence for fracture. No worrisome lytic or sclerotic
lesion.

Sinuses/Orbits: The visualized paranasal sinuses and mastoid air
cells are clear. Visualized portions of the globes and intraorbital
fat are unremarkable.

Other: None.
IMPRESSION: 1. No acute intracranial abnormality.
2. Atrophy with chronic small vessel white matter ischemic disease.

## 2019-01-30 DIAGNOSIS — L089 Local infection of the skin and subcutaneous tissue, unspecified: Secondary | ICD-10-CM | POA: Diagnosis not present

## 2019-02-26 DIAGNOSIS — I10 Essential (primary) hypertension: Secondary | ICD-10-CM | POA: Diagnosis not present

## 2019-02-26 DIAGNOSIS — E1169 Type 2 diabetes mellitus with other specified complication: Secondary | ICD-10-CM | POA: Diagnosis not present

## 2019-03-21 ENCOUNTER — Ambulatory Visit: Payer: Self-pay | Attending: Internal Medicine

## 2019-03-21 DIAGNOSIS — Z23 Encounter for immunization: Secondary | ICD-10-CM

## 2019-03-21 NOTE — Progress Notes (Signed)
   Covid-19 Vaccination Clinic  Name:  Chad Weeks    MRN: [id number] DOB: 1955/04/24  03/21/2019  Chad Weeks was observed post Covid-19 immunization for 15 minutes without incident. He was provided with Vaccine Information Sheet and instruction to access the V-Safe system.   Chad Weeks was instructed to call 911 with any severe reactions post vaccine: Marland Kitchen Difficulty breathing  . Swelling of face and throat  . A fast heartbeat  . A bad rash all over body  . Dizziness and weakness   Immunizations Administered    Name Date Dose VIS Date Route   Pfizer COVID-19 Vaccine 03/21/2019  2:01 PM 0.3 mL 12/21/2018 Intramuscular   Manufacturer: [employer], [employer]   Lot: [id number]   NDC: [address]-8

## 2019-04-16 ENCOUNTER — Ambulatory Visit: Payer: Self-pay | Attending: Internal Medicine

## 2019-04-16 DIAGNOSIS — Z23 Encounter for immunization: Secondary | ICD-10-CM

## 2019-04-16 NOTE — Progress Notes (Signed)
   Covid-19 Vaccination Clinic  Name:  Chad Weeks    MRN: [id number] DOB: 06/28/1955  04/16/2019  [name] was observed post Covid-19 immunization for 15 minutes without incident. He was provided with Vaccine Information Sheet and instruction to access the V-Safe system.   [name] was instructed to call 911 with any severe reactions post vaccine: Marland Kitchen Difficulty breathing  . Swelling of face and throat  . A fast heartbeat  . A bad rash all over body  . Dizziness and weakness   Immunizations Administered    Name Date Dose VIS Date Route   Pfizer COVID-19 Vaccine 04/16/2019  2:03 PM 0.3 mL 12/21/2018 Intramuscular   Manufacturer: [employer], [employer]   Lot: [id number]   NDC: [address]-3

## 2019-05-03 DIAGNOSIS — E1169 Type 2 diabetes mellitus with other specified complication: Secondary | ICD-10-CM | POA: Diagnosis not present

## 2019-05-16 DIAGNOSIS — L600 Ingrowing nail: Secondary | ICD-10-CM | POA: Diagnosis not present

## 2019-05-16 DIAGNOSIS — L609 Nail disorder, unspecified: Secondary | ICD-10-CM | POA: Diagnosis not present

## 2019-07-03 ENCOUNTER — Ambulatory Visit: Payer: BC Managed Care – PPO | Admitting: Podiatry

## 2019-07-03 ENCOUNTER — Encounter: Payer: Self-pay | Admitting: Podiatry

## 2019-07-03 ENCOUNTER — Other Ambulatory Visit: Payer: Self-pay

## 2019-07-03 DIAGNOSIS — L600 Ingrowing nail: Secondary | ICD-10-CM

## 2019-07-03 MED ORDER — CEPHALEXIN 500 MG PO CAPS: 500.0000 mg | ORAL_CAPSULE | Freq: Three times a day (TID) | ORAL | 0 refills | Status: AC

## 2019-07-03 MED ORDER — GENTAMICIN SULFATE 0.1 % EX CREA: 1.0000 "application " | TOPICAL_CREAM | Freq: Two times a day (BID) | CUTANEOUS | 1 refills | Status: AC

## 2019-07-03 NOTE — Patient Instructions (Signed)

## 2019-07-03 NOTE — Progress Notes (Signed)
   Subjective: Patient presents today for evaluation of pain to the medial lateral border of the left hallux.  Patient states he has been dealing with this for several years and normally he tries to trim and debride the ingrown's out.  Patient is concerned for possible ingrown nail. Patient presents today for further treatment and evaluation.  Past Medical History:  Diagnosis Date  . Diabetes mellitus without complication (HCC)   . Foreign body (FB) in soft tissue    left ring finger  . GERD (gastroesophageal reflux disease)   . Hyperlipidemia   . Hypertension     Objective:  General: Well developed, nourished, in no acute distress, alert and oriented x3   Dermatology: Skin is warm, dry and supple bilateral.  Medial lateral border left hallux appears to be erythematous with evidence of an ingrowing nail. Pain on palpation noted to the border of the nail fold. The remaining nails appear unremarkable at this time. There are no open sores, lesions.  Vascular: Dorsalis Pedis artery and Posterior Tibial artery pedal pulses palpable. No lower extremity edema noted.   Neruologic: Grossly intact via light touch bilateral.  Musculoskeletal: Muscular strength within normal limits in all groups bilateral. Normal range of motion noted to all pedal and ankle joints.   Assesement: #1 Paronychia with ingrowing nail medial lateral border left hallux #2 Pain in toe #3 Incurvated nail  Plan of Care:  1. Patient evaluated.  2. Discussed treatment alternatives and plan of care. Explained nail avulsion procedure and post procedure course to patient. 3. Patient opted for permanent partial nail avulsion of the medial lateral border left hallux.  4. Prior to procedure, local anesthesia infiltration utilized using 3 ml of a 50:50 mixture of 2% plain lidocaine and 0.5% plain marcaine in a normal hallux block fashion and a betadine prep performed.  5. Partial permanent nail avulsion with chemical  matrixectomy performed using 3x30sec applications of phenol followed by alcohol flush.  6. Light dressing applied. 7.  Prescription for Keflex 500 mg 3 times daily x10 days  8.  Prescription for gentamicin cream  9.  return to clinic 2 weeks.  Felecia Shelling, DPM [facility]  Dr. Felecia Shelling, DPM    [address]                                        [place], [place] [address]                Office [phone]  Fax [phone]

## 2019-07-24 ENCOUNTER — Ambulatory Visit: Payer: BC Managed Care – PPO | Admitting: Podiatry

## 2019-07-24 ENCOUNTER — Other Ambulatory Visit: Payer: Self-pay

## 2019-07-24 ENCOUNTER — Encounter: Payer: Self-pay | Admitting: Podiatry

## 2019-07-24 DIAGNOSIS — Z9889 Other specified postprocedural states: Secondary | ICD-10-CM

## 2019-07-24 DIAGNOSIS — E119 Type 2 diabetes mellitus without complications: Secondary | ICD-10-CM

## 2019-07-24 DIAGNOSIS — L600 Ingrowing nail: Secondary | ICD-10-CM

## 2019-07-26 NOTE — Progress Notes (Signed)
Subjective:  Chad Weeks presents today s/p partial matrixectomy medial border L hallux performed by Dr. Logan Bores on 07/03/2019.  Patient states he has been following post procedure instructions. He states toe is slowly returning to its normal color. Patient states toe feels better.  He is concerned about having same procedure done on right great toe lateral border. He states toe is tender.  Objective: There were no vitals filed for this visit.  64 y.o. male WD, WN IN NAD. AAO X 3.  Capillary refill time to digits immediate b/l. Palpable pedal pulses b/l LE. Pedal hair present. Lower extremity skin temperature gradient within normal limits. No edema noted b/l lower extremities.  Pedal skin with normal turgor, texture and tone bilaterally. No open wounds bilaterally. No interdigital macerations bilaterally. Incurvated nailplate medial and lateral border(s) R hallux.  Nail border hypertrophy not present. There is tenderness to palpation. Sign(s) of infection: no clinical signs of infection noted on examination today..Procedure site of L hallux noted to be healing as expected with no erythema, no edema, no drainage, no purulence.  Normal muscle strength 5/5 to all lower extremity muscle groups bilaterally. No pain crepitus or joint limitation noted with ROM b/l. No gross bony deformities bilaterally. Patient ambulates independent of any assistive aids.  Protective sensation intact 5/5 intact bilaterally with 10g monofilament b/l. Proprioception intact bilaterally. Clonus negative b/l.  Assessment: Status post matrixectomy medial border left hallux, healing as expected  Ingrown toenail right hallux lateral border NIDDM  Plan:  -Examined patient. Cleaned medial border and cleansed with alcohol  -Patient to continue epsom salt soaks and Gentamicin Cream for an additional week. -Offending nail borders debrided and curretaged right hallux. Border cleansed with alcohol and triple antibiotic applied.  He will soak with left foot as well and apply Gentamicin for one week. Call if he has any problems. -Patient to report any pedal injuries to medical professional immediately. -Patient to continue soft, supportive shoe gear daily. -Patient/POA to call should there be question/concern in the interim.  Return in about 2 months (around 09/24/2019).

## 2019-07-27 ENCOUNTER — Encounter: Payer: Self-pay | Admitting: Podiatry

## 2019-09-19 DIAGNOSIS — E1169 Type 2 diabetes mellitus with other specified complication: Secondary | ICD-10-CM | POA: Diagnosis not present

## 2019-09-19 DIAGNOSIS — E7800 Pure hypercholesterolemia, unspecified: Secondary | ICD-10-CM | POA: Diagnosis not present

## 2019-09-19 DIAGNOSIS — L570 Actinic keratosis: Secondary | ICD-10-CM | POA: Diagnosis not present

## 2019-09-19 DIAGNOSIS — K219 Gastro-esophageal reflux disease without esophagitis: Secondary | ICD-10-CM | POA: Diagnosis not present

## 2019-09-19 DIAGNOSIS — Z0000 Encounter for general adult medical examination without abnormal findings: Secondary | ICD-10-CM | POA: Diagnosis not present

## 2019-09-19 DIAGNOSIS — I10 Essential (primary) hypertension: Secondary | ICD-10-CM | POA: Diagnosis not present

## 2019-09-19 DIAGNOSIS — Z125 Encounter for screening for malignant neoplasm of prostate: Secondary | ICD-10-CM | POA: Diagnosis not present

## 2019-09-25 ENCOUNTER — Ambulatory Visit: Payer: BC Managed Care – PPO | Admitting: Podiatry

## 2019-10-07 DIAGNOSIS — H1045 Other chronic allergic conjunctivitis: Secondary | ICD-10-CM | POA: Diagnosis not present

## 2019-10-08 DIAGNOSIS — Z23 Encounter for immunization: Secondary | ICD-10-CM | POA: Diagnosis not present

## 2019-10-08 DIAGNOSIS — E538 Deficiency of other specified B group vitamins: Secondary | ICD-10-CM | POA: Diagnosis not present

## 2019-11-05 DIAGNOSIS — E538 Deficiency of other specified B group vitamins: Secondary | ICD-10-CM | POA: Diagnosis not present

## 2019-12-03 DIAGNOSIS — E538 Deficiency of other specified B group vitamins: Secondary | ICD-10-CM | POA: Diagnosis not present

## 2019-12-30 DIAGNOSIS — L578 Other skin changes due to chronic exposure to nonionizing radiation: Secondary | ICD-10-CM | POA: Diagnosis not present

## 2019-12-30 DIAGNOSIS — D1801 Hemangioma of skin and subcutaneous tissue: Secondary | ICD-10-CM | POA: Diagnosis not present

## 2019-12-30 DIAGNOSIS — L570 Actinic keratosis: Secondary | ICD-10-CM | POA: Diagnosis not present

## 2019-12-30 DIAGNOSIS — L820 Inflamed seborrheic keratosis: Secondary | ICD-10-CM | POA: Diagnosis not present

## 2020-01-08 DIAGNOSIS — E538 Deficiency of other specified B group vitamins: Secondary | ICD-10-CM | POA: Diagnosis not present

## 2020-02-06 DIAGNOSIS — E538 Deficiency of other specified B group vitamins: Secondary | ICD-10-CM | POA: Diagnosis not present

## 2020-02-25 DIAGNOSIS — L570 Actinic keratosis: Secondary | ICD-10-CM | POA: Diagnosis not present

## 2020-02-25 DIAGNOSIS — L918 Other hypertrophic disorders of the skin: Secondary | ICD-10-CM | POA: Diagnosis not present

## 2020-02-25 DIAGNOSIS — L821 Other seborrheic keratosis: Secondary | ICD-10-CM | POA: Diagnosis not present

## 2020-03-06 DIAGNOSIS — E538 Deficiency of other specified B group vitamins: Secondary | ICD-10-CM | POA: Diagnosis not present

## 2020-03-19 DIAGNOSIS — K219 Gastro-esophageal reflux disease without esophagitis: Secondary | ICD-10-CM | POA: Diagnosis not present

## 2020-03-19 DIAGNOSIS — E538 Deficiency of other specified B group vitamins: Secondary | ICD-10-CM | POA: Diagnosis not present

## 2020-03-19 DIAGNOSIS — E1169 Type 2 diabetes mellitus with other specified complication: Secondary | ICD-10-CM | POA: Diagnosis not present

## 2020-04-07 DIAGNOSIS — E538 Deficiency of other specified B group vitamins: Secondary | ICD-10-CM | POA: Diagnosis not present

## 2020-05-06 DIAGNOSIS — E538 Deficiency of other specified B group vitamins: Secondary | ICD-10-CM | POA: Diagnosis not present

## 2020-06-10 DIAGNOSIS — Z20822 Contact with and (suspected) exposure to covid-19: Secondary | ICD-10-CM | POA: Diagnosis not present

## 2020-06-10 DIAGNOSIS — R0602 Shortness of breath: Secondary | ICD-10-CM | POA: Diagnosis not present

## 2020-06-10 DIAGNOSIS — U071 COVID-19: Secondary | ICD-10-CM | POA: Diagnosis not present

## 2020-06-10 DIAGNOSIS — R059 Cough, unspecified: Secondary | ICD-10-CM | POA: Diagnosis not present

## 2020-07-07 DIAGNOSIS — M25511 Pain in right shoulder: Secondary | ICD-10-CM | POA: Diagnosis not present

## 2020-07-07 DIAGNOSIS — E538 Deficiency of other specified B group vitamins: Secondary | ICD-10-CM | POA: Diagnosis not present

## 2020-08-04 DIAGNOSIS — E538 Deficiency of other specified B group vitamins: Secondary | ICD-10-CM | POA: Diagnosis not present

## 2020-08-20 DIAGNOSIS — M25511 Pain in right shoulder: Secondary | ICD-10-CM | POA: Diagnosis not present

## 2020-08-24 DIAGNOSIS — M25511 Pain in right shoulder: Secondary | ICD-10-CM | POA: Diagnosis not present

## 2020-08-27 DIAGNOSIS — M25511 Pain in right shoulder: Secondary | ICD-10-CM | POA: Diagnosis not present

## 2020-09-01 DIAGNOSIS — E538 Deficiency of other specified B group vitamins: Secondary | ICD-10-CM | POA: Diagnosis not present

## 2020-09-01 DIAGNOSIS — M25511 Pain in right shoulder: Secondary | ICD-10-CM | POA: Diagnosis not present

## 2020-09-03 DIAGNOSIS — M25511 Pain in right shoulder: Secondary | ICD-10-CM | POA: Diagnosis not present

## 2020-09-17 DIAGNOSIS — E7800 Pure hypercholesterolemia, unspecified: Secondary | ICD-10-CM | POA: Diagnosis not present

## 2020-09-17 DIAGNOSIS — M25511 Pain in right shoulder: Secondary | ICD-10-CM | POA: Diagnosis not present

## 2020-09-17 DIAGNOSIS — Z0000 Encounter for general adult medical examination without abnormal findings: Secondary | ICD-10-CM | POA: Diagnosis not present

## 2020-09-17 DIAGNOSIS — Z125 Encounter for screening for malignant neoplasm of prostate: Secondary | ICD-10-CM | POA: Diagnosis not present

## 2020-09-17 DIAGNOSIS — E1169 Type 2 diabetes mellitus with other specified complication: Secondary | ICD-10-CM | POA: Diagnosis not present

## 2020-09-17 DIAGNOSIS — E559 Vitamin D deficiency, unspecified: Secondary | ICD-10-CM | POA: Diagnosis not present

## 2020-09-22 DIAGNOSIS — M25511 Pain in right shoulder: Secondary | ICD-10-CM | POA: Diagnosis not present

## 2020-09-24 DIAGNOSIS — M25511 Pain in right shoulder: Secondary | ICD-10-CM | POA: Diagnosis not present

## 2020-09-30 DIAGNOSIS — E538 Deficiency of other specified B group vitamins: Secondary | ICD-10-CM | POA: Diagnosis not present

## 2020-09-30 DIAGNOSIS — E1169 Type 2 diabetes mellitus with other specified complication: Secondary | ICD-10-CM | POA: Diagnosis not present

## 2020-09-30 DIAGNOSIS — Z23 Encounter for immunization: Secondary | ICD-10-CM | POA: Diagnosis not present

## 2020-09-30 DIAGNOSIS — I10 Essential (primary) hypertension: Secondary | ICD-10-CM | POA: Diagnosis not present

## 2020-09-30 DIAGNOSIS — Z0000 Encounter for general adult medical examination without abnormal findings: Secondary | ICD-10-CM | POA: Diagnosis not present

## 2020-09-30 DIAGNOSIS — K219 Gastro-esophageal reflux disease without esophagitis: Secondary | ICD-10-CM | POA: Diagnosis not present

## 2020-10-28 DIAGNOSIS — E538 Deficiency of other specified B group vitamins: Secondary | ICD-10-CM | POA: Diagnosis not present

## 2020-10-28 DIAGNOSIS — M25511 Pain in right shoulder: Secondary | ICD-10-CM | POA: Diagnosis not present

## 2020-12-10 DIAGNOSIS — J069 Acute upper respiratory infection, unspecified: Secondary | ICD-10-CM | POA: Diagnosis not present

## 2020-12-10 DIAGNOSIS — J209 Acute bronchitis, unspecified: Secondary | ICD-10-CM | POA: Diagnosis not present

## 2020-12-28 DIAGNOSIS — J329 Chronic sinusitis, unspecified: Secondary | ICD-10-CM | POA: Diagnosis not present

## 2021-02-25 DIAGNOSIS — L570 Actinic keratosis: Secondary | ICD-10-CM | POA: Diagnosis not present

## 2021-02-25 DIAGNOSIS — D2372 Other benign neoplasm of skin of left lower limb, including hip: Secondary | ICD-10-CM | POA: Diagnosis not present

## 2021-02-25 DIAGNOSIS — L821 Other seborrheic keratosis: Secondary | ICD-10-CM | POA: Diagnosis not present

## 2021-02-25 DIAGNOSIS — D1801 Hemangioma of skin and subcutaneous tissue: Secondary | ICD-10-CM | POA: Diagnosis not present
